# Patient Record
Sex: Male | Born: 1935 | Race: White | Hispanic: No | Marital: Married | Smoking: Former smoker
Health system: Southern US, Community
[De-identification: ages and names within clinical notes are randomized; demographics above are authoritative.]

## PROBLEM LIST (undated history)

## (undated) DIAGNOSIS — G4733 Obstructive sleep apnea (adult) (pediatric): Secondary | ICD-10-CM

## (undated) DIAGNOSIS — Z79891 Long term (current) use of opiate analgesic: Secondary | ICD-10-CM

## (undated) DIAGNOSIS — K219 Gastro-esophageal reflux disease without esophagitis: Secondary | ICD-10-CM

## (undated) DIAGNOSIS — E785 Hyperlipidemia, unspecified: Secondary | ICD-10-CM

## (undated) DIAGNOSIS — I10 Essential (primary) hypertension: Secondary | ICD-10-CM

## (undated) DIAGNOSIS — M419 Scoliosis, unspecified: Secondary | ICD-10-CM

## (undated) DIAGNOSIS — R251 Tremor, unspecified: Secondary | ICD-10-CM

## (undated) DIAGNOSIS — N390 Urinary tract infection, site not specified: Secondary | ICD-10-CM

## (undated) DIAGNOSIS — I2510 Atherosclerotic heart disease of native coronary artery without angina pectoris: Secondary | ICD-10-CM

## (undated) DIAGNOSIS — N200 Calculus of kidney: Secondary | ICD-10-CM

## (undated) DIAGNOSIS — N400 Enlarged prostate without lower urinary tract symptoms: Secondary | ICD-10-CM

## (undated) DIAGNOSIS — M1990 Unspecified osteoarthritis, unspecified site: Secondary | ICD-10-CM

## (undated) DIAGNOSIS — F419 Anxiety disorder, unspecified: Secondary | ICD-10-CM

## (undated) HISTORY — DX: Gastro-esophageal reflux disease without esophagitis: K21.9

## (undated) HISTORY — DX: Benign prostatic hyperplasia without lower urinary tract symptoms: N40.0

## (undated) HISTORY — DX: Scoliosis, unspecified: M41.9

## (undated) HISTORY — DX: Essential (primary) hypertension: I10

## (undated) HISTORY — DX: Hyperlipidemia, unspecified: E78.5

## (undated) HISTORY — PX: OTHER SURGICAL HISTORY: SHX169

## (undated) HISTORY — DX: Urinary tract infection, site not specified: N39.0

## (undated) HISTORY — DX: Tremor, unspecified: R25.1

## (undated) HISTORY — DX: Unspecified osteoarthritis, unspecified site: M19.90

## (undated) HISTORY — DX: Anxiety disorder, unspecified: F41.9

## (undated) HISTORY — DX: Atherosclerotic heart disease of native coronary artery without angina pectoris: I25.10

## (undated) HISTORY — DX: Calculus of kidney: N20.0

## (undated) HISTORY — DX: Long term (current) use of opiate analgesic: Z79.891

## (undated) HISTORY — DX: Obstructive sleep apnea (adult) (pediatric): G47.33

## (undated) HISTORY — PX: APPENDECTOMY: SHX54

---

## 1998-03-24 ENCOUNTER — Ambulatory Visit (HOSPITAL_COMMUNITY): Admission: RE | Admit: 1998-03-24 | Discharge: 1998-03-24 | Payer: Self-pay | Admitting: Gastroenterology

## 1998-03-24 ENCOUNTER — Encounter: Payer: Self-pay | Admitting: Gastroenterology

## 1998-03-27 ENCOUNTER — Ambulatory Visit (HOSPITAL_COMMUNITY): Admission: RE | Admit: 1998-03-27 | Discharge: 1998-03-27 | Payer: Self-pay | Admitting: Gastroenterology

## 1998-03-27 ENCOUNTER — Encounter: Payer: Self-pay | Admitting: Gastroenterology

## 2004-09-11 ENCOUNTER — Encounter: Payer: Self-pay | Admitting: Internal Medicine

## 2007-09-25 ENCOUNTER — Ambulatory Visit: Payer: Self-pay | Admitting: *Deleted

## 2007-09-25 ENCOUNTER — Emergency Department (HOSPITAL_COMMUNITY): Admission: EM | Admit: 2007-09-25 | Discharge: 2007-09-26 | Payer: Self-pay | Admitting: *Deleted

## 2007-09-29 ENCOUNTER — Inpatient Hospital Stay (HOSPITAL_COMMUNITY): Admission: AD | Admit: 2007-09-29 | Discharge: 2007-10-07 | Payer: Self-pay | Admitting: *Deleted

## 2007-09-30 ENCOUNTER — Encounter (INDEPENDENT_AMBULATORY_CARE_PROVIDER_SITE_OTHER): Payer: Self-pay | Admitting: *Deleted

## 2007-09-30 HISTORY — PX: CARDIAC CATHETERIZATION: SHX172

## 2007-10-01 ENCOUNTER — Encounter (INDEPENDENT_AMBULATORY_CARE_PROVIDER_SITE_OTHER): Payer: Self-pay | Admitting: *Deleted

## 2007-10-01 ENCOUNTER — Ambulatory Visit: Payer: Self-pay | Admitting: Thoracic Surgery (Cardiothoracic Vascular Surgery)

## 2007-10-02 HISTORY — PX: CORONARY ARTERY BYPASS GRAFT: SHX141

## 2007-11-02 ENCOUNTER — Encounter
Admission: RE | Admit: 2007-11-02 | Discharge: 2007-11-02 | Payer: Self-pay | Admitting: Thoracic Surgery (Cardiothoracic Vascular Surgery)

## 2007-11-02 ENCOUNTER — Ambulatory Visit: Payer: Self-pay | Admitting: Thoracic Surgery (Cardiothoracic Vascular Surgery)

## 2007-11-12 ENCOUNTER — Encounter (HOSPITAL_COMMUNITY): Admission: RE | Admit: 2007-11-12 | Discharge: 2008-01-01 | Payer: Self-pay | Admitting: *Deleted

## 2008-02-03 ENCOUNTER — Encounter (HOSPITAL_COMMUNITY): Admission: RE | Admit: 2008-02-03 | Discharge: 2008-03-28 | Payer: Self-pay | Admitting: *Deleted

## 2008-06-03 ENCOUNTER — Encounter: Admission: RE | Admit: 2008-06-03 | Discharge: 2008-06-03 | Payer: Self-pay | Admitting: Internal Medicine

## 2008-07-14 ENCOUNTER — Ambulatory Visit (HOSPITAL_COMMUNITY): Admission: RE | Admit: 2008-07-14 | Discharge: 2008-07-14 | Payer: Self-pay | Admitting: Gastroenterology

## 2009-02-14 ENCOUNTER — Encounter: Admission: RE | Admit: 2009-02-14 | Discharge: 2009-02-14 | Payer: Self-pay | Admitting: Internal Medicine

## 2009-02-20 ENCOUNTER — Encounter: Admission: RE | Admit: 2009-02-20 | Discharge: 2009-02-20 | Payer: Self-pay | Admitting: Internal Medicine

## 2009-06-14 ENCOUNTER — Encounter: Admission: RE | Admit: 2009-06-14 | Discharge: 2009-06-14 | Payer: Self-pay | Admitting: Gastroenterology

## 2009-08-29 ENCOUNTER — Ambulatory Visit: Payer: Self-pay | Admitting: Cardiovascular Disease

## 2009-09-14 ENCOUNTER — Ambulatory Visit: Payer: Self-pay | Admitting: Cardiovascular Disease

## 2009-12-10 IMAGING — CR DG CHEST 1V PORT
1 series · 1 of 1 positions shown · non-contrast
Comparison: Portable exam 0109 hours without priors for comparison

CLINICAL DATA: Chest pain, shortness of breath, tachycardia

PORTABLE CHEST - 1 VIEW

[view not recorded]
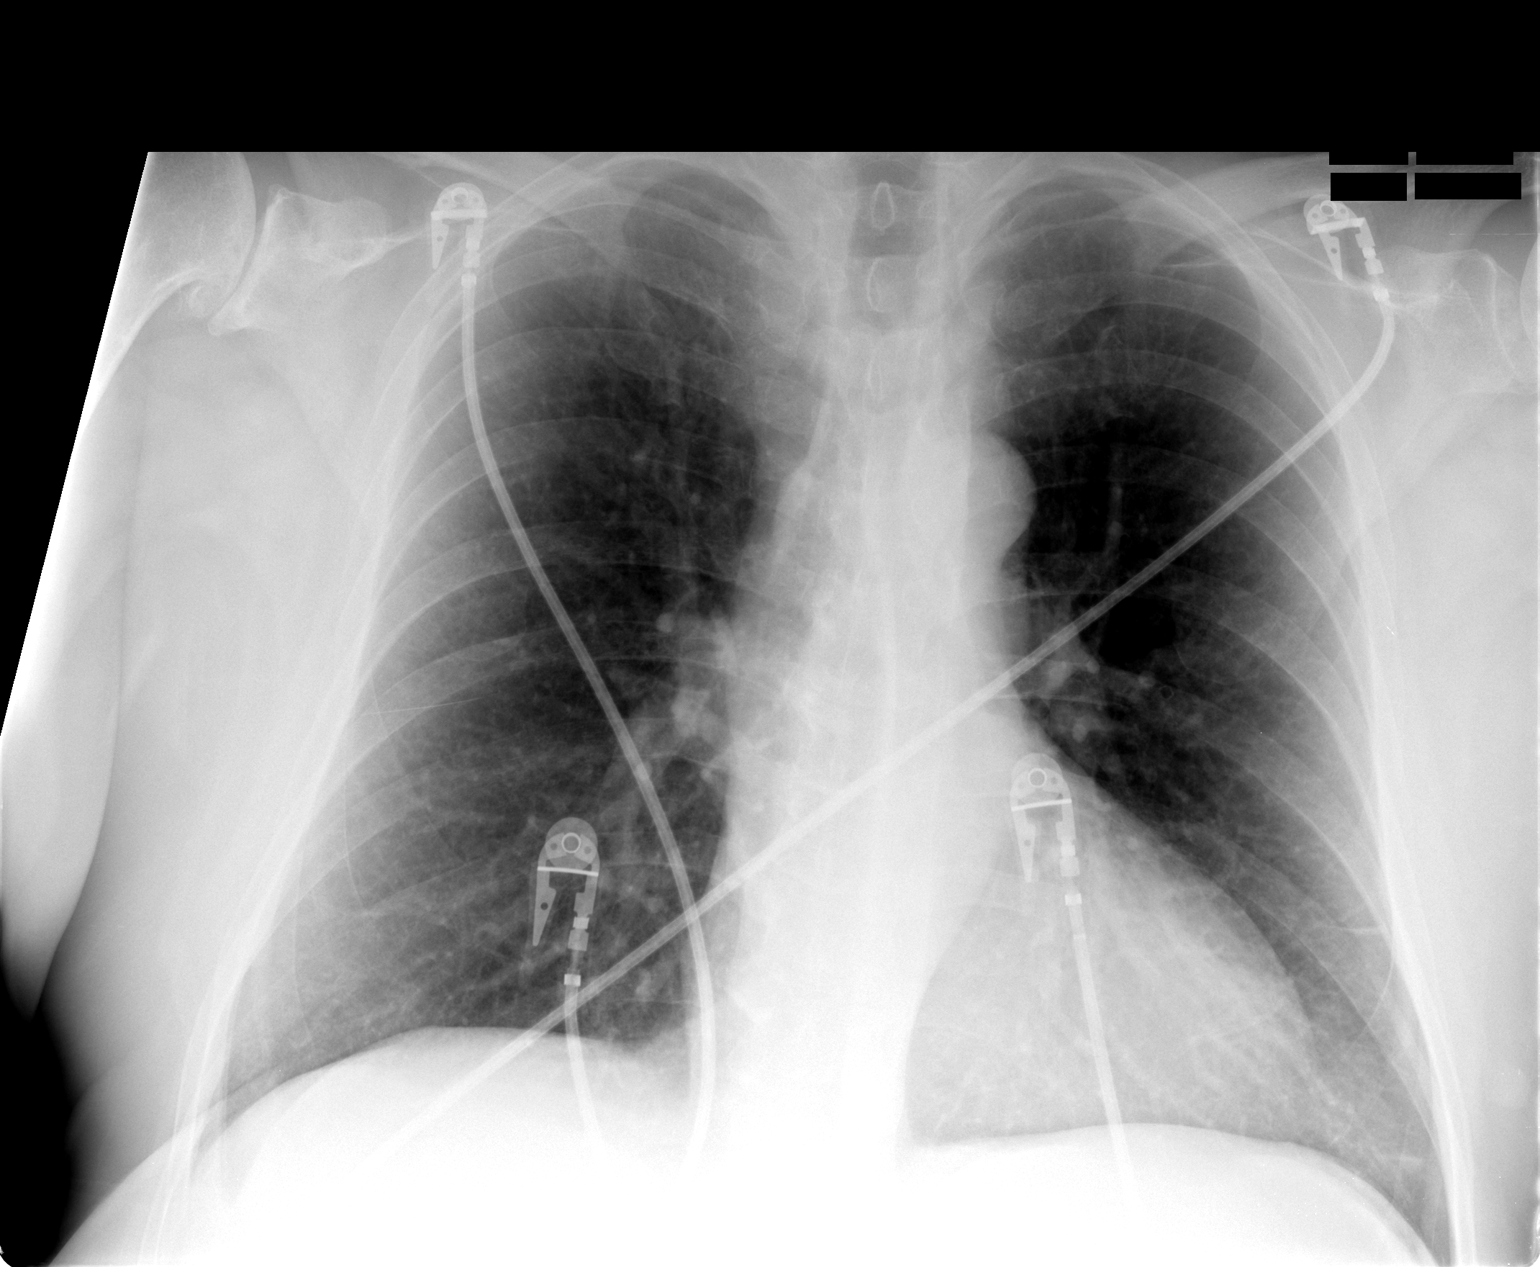

[1 of 1 positions shown; findings below may reference images not displayed]

FINDINGS: Upper normal heart size.
Tortuous aorta.
Pulmonary vascularity normal.
Lungs clear.
Right glenohumeral degenerative changes.
IMPRESSION: No acute abnormalities.

## 2010-02-18 ENCOUNTER — Encounter: Payer: Self-pay | Admitting: Internal Medicine

## 2010-02-22 ENCOUNTER — Ambulatory Visit: Payer: Self-pay | Admitting: Cardiovascular Disease

## 2010-02-27 NOTE — Letter (Signed)
Summary: [facility]   Imported By: Sherian Rein 07/14/2009 11:15:30  _____________________________________________________________________  External Attachment:    Type:   Image     Comment:   External Document

## 2010-05-07 LAB — CLOTEST (H. PYLORI), BIOPSY: Helicobacter screen: NEGATIVE

## 2010-06-12 NOTE — Op Note (Signed)
NAMELORENZA, Michael Donaldson              ACCOUNT NO.:  [id number]   MEDICAL RECORD NO.:  [id number]          PATIENT TYPE:  INP   LOCATION:  2316                         FACILITY:  [facility]   PHYSICIAN:  Burna Forts, M.D.DATE OF BIRTH:  July 07, 1934   DATE OF PROCEDURE:  10/02/2007  DATE OF DISCHARGE:                               OPERATIVE REPORT   INDICATIONS FOR PROCEDURE:  Mr. Shackleton is a 76 year old gentleman  referred by Dr. Lady Deutscher for coronary artery bypass grafting to be  performed by Dr. Tressie Stalker.  We have been asked to place a TEE probe  for evaluation of cardiac structures and function.   The patient was brought to the holding area on the morning of surgery  where under local anesthesia with sedation, radial and pulmonary lines  were started.  He was then taken to OR for routine induction of general  anesthesia after which the TEE probe is protected, lubricated, and  passed oropharyngeally into the stomach and then slightly withdrawn for  imaging the cardiac structures.   PRE-CARDIOPULMONARY BYPASS TEE EXAMINATION:  Left ventricle.  Left  ventricular chamber is initially seen in the short-axis view.  There is  mild-to-moderate left ventricular hypertrophy seen.  In the short-axis  view, there appears to be normal overall contractility with all septal  and inferior and posterior and anterior walls thickening and contracting  in a normal pattern.  EF is estimated to be in the 40-45% range.  The  long-axis view suggests some mild apical and anteroseptal hypokinesis  but overall this is satisfactory appearing ventricle both in long and  short-axis views in terms of his contractility.   Papillary muscles are well outlined, and there are no masses noted  within.   Mitral valve.  This is a thin, compliant, mobile mitral valve apparatus  seen in multiple views.  There is satisfactory coaptation just below the  level of the mitral annulus.  During systolic contraction,  this is  associated with only trace mitral regurgitant flow.  There is no  obstruction to inflow and appears to be a satisfactory and normal mitral  valve.   Left atrial chamber is normal in size and function.   Aortic valve.  There is mild aortic sclerosis.  There are 3 cusps  opening widely with no obstruction to flow and on diastole, there is no  aortic insufficiency appreciated.   The right ventricle, tricuspid valve, and right atrial chambers are all  normal in size and function and structure.   The patient was placed on cardiopulmonary bypass.  Coronary artery  bypass grafting is carried out.  The patient was then separated from  cardiopulmonary bypass with the initial attempt.   POST-CARDIOPULMONARY BYPASS TEE EXAMINATION:  Left ventricle.  There is  good excellent contractility noted in the left ventricular chamber in  this post bypass period, seen in both short-axis and long-axis views.  There remains a mild hypocontractile area apically and anteroseptally,  but overall there is good contractile pattern noted in this post-bypass.   No other changes from the pre-bypass period are appreciated.  The mitral  regurgitant jet remained small and trace, and there are no other further  apparent changes noted.  The patient is returned to the cardiac  intensive care unit in stable condition.           ______________________________  Burna Forts, M.D.     JTM/MEDQ  D:  10/02/2007  T:  10/03/2007  Job:  [id number]

## 2010-06-12 NOTE — H&P (Signed)
[name], Michael Donaldson              ACCOUNT NO.:  [id number]   MEDICAL RECORD NO.:  [id number]          PATIENT TYPE:  INP   LOCATION:                               FACILITY:  [facility]   PHYSICIAN:  Elmore Guise., M.D.DATE OF BIRTH:  16-Mar-1934   DATE OF ADMISSION:  09/29/2007  DATE OF DISCHARGE:                              HISTORY & PHYSICAL   INDICATIONS FOR ADMISSION:  Recurrent chest pain, now with abnormal  stress test.   HISTORY OF PRESENT ILLNESS:  Michael Donaldson is a very pleasant 76 year old  white male with a past medical history of hypertension and obstructive  sleep apnea, who had recent ER evaluation on September 26, 2007.  At that  time, he was having some chest pain in his lower sternal area, which  radiated to his abdomen.  He had no nausea, no significant shortness of  breath.  This lasted for an hour.  He came for evaluation in the  emergency room and everything was normal.  He was seen by cardiologist  on call and sent out of the hospital with outpatient follow-up.  His  cardiac enzymes and ECG were normal.  Since going home, he has had  recurrence of lower sternal chest pain, now with radiation to his left  arm.  Today he came in for a stress test.  He walked for 5 minutes,  however, started having chest pain at 2 minutes and 45 seconds into  exercise.  He had no significant ECG changes, but did have hypertensive  blood pressure response with peak systolic blood pressure 200/100.  He  continued with chest pain 10 minutes post exercise.  His stress images  showed a moderate-sized inferoapical defect.  His EF was 56% with no  wall motion abnormalities.  He was given sublingual nitroglycerin spray  with significant improvement in both blood pressure and chest pain  symptoms.  By the time he had left the office, he was having no further  chest discomfort.  He reports prior to coming to the ER on Friday, his  symptoms started approximately a week to a week and a half ago  and these  would come and go, but with primarily with significant activity.  He  initially thought this was his anxiety acting up.  He has had no  fever, chills, nausea, vomiting, or diarrhea.  No problems with cough.  He was recently diagnosed with his sleep apnea and was due to start his  CPAP treatment tonight.   REVIEW OF SYSTEMS:  As per HPI.  All others are negative.   CURRENT MEDICATIONS:  1. Ambien 5 mg p.r.n.  2. Aspirin 81 mg daily.  3. Lisinopril 10 mg daily.  4. Naproxen p.r.n.   ALLERGIES:  None.   FAMILY HISTORY:  Positive for heart disease with his father.   SOCIAL HISTORY:  He is married.  Denies any alcohol.  Has remote tobacco  use, greater than 40 years ago.   PHYSICAL EXAMINATION:  VITAL SIGNS:  His blood pressure is 160/80, his  heart rate is 60 to 70, showing sinus rhythm.  GENERAL:  He is a very pleasant elderly white male, alert and oriented  times four, in no acute distress.  NECK:  He has no JVD, no bruits.  LUNGS:  Clear, with breath sounds to the bases.  HEART:  Regular, with normal S1 and S2.  A very soft 2/6 holosystolic  murmur is noted.  ABDOMEN:  Obese, soft, nontender, nondistended.  No rebound or guarding.  EXTREMITIES:  Warm with 2+ pulses and no significant edema.   LABORATORY DATA:  His blood work from his most recent hospitalization  and ER evaluation showed negative cardiac markers times three, with peak  myoglobin of 108.  D-dimer was 0.48.  His BUN and creatinine were 21 and  1.2, his potassium level was 3.6.  Hemoglobin of 15.0.  His ECG today  shows normal sinus rhythm, normal axis, small inferior Q-waves are  noted, no significant ST or T-wave changes.   IMPRESSIONS:  1. Recurrence of chest pain with minimal exertion on Bruce protocol,      consistent with unstable angina.  2. Recent abnormal Cardiolite, showing a moderate-sized inferoapical      defect.  3. History of hypertension.  4. Obstructive sleep apnea.   PLAN:  The  patient will be admitted to the hospital.  We will start  Lovenox 1 mg/kg subcu times one dose.  He is currently chest pain-free.  He will be given aspirin, metoprolol 25 mg q.12 h., as well as low dose  lisinopril.  We will start Lipitor 80 mg once daily.  I discussed  cardiac catheterization with possible intervention and this will be  performed tomorrow unless he has worsening symptoms that cannot be  controlled medically.  He is currently chest pain-free.  All his  questions were answered prior to sending him to the hospital.      Elmore Guise., M.D.  Electronically Signed     TWK/MEDQ  D:  09/29/2007  T:  09/29/2007  Job:  [id number]

## 2010-06-12 NOTE — Consult Note (Signed)
Michael Donaldson, FRIEDLAND              ACCOUNT NO.:  [id number]   MEDICAL RECORD NO.:  [id number]          PATIENT TYPE:  EMS   LOCATION:  MAJO                         FACILITY:  [facility]   PHYSICIAN:  [name], MDDATE OF BIRTH:  1934-03-13   DATE OF CONSULTATION:  DATE OF DISCHARGE:  09/26/2007                                 CONSULTATION   REFERRING PHYSICIAN:  Chriss Driver, MD   REASON FOR CONSULTATION:  Chest pain syndrome.   HISTORY OF PRESENT ILLNESS:  The patient is a pleasant 76 year old  gentleman with history of chronic hypertension, obstructive sleep apnea,  and had presented to the ER with chest pain.  The patient localizes the  chest pain to include the lower sternal area on both sides as well as  the abdomen.  He does not complain of any nausea or emesis.  The chest  pain was relieved in an hour.  He did not complain of any prior such  episodes.  He does not have any history of exertional angina.  His first  set of cardiac biomarkers are negative, and electrocardiogram did not  show any acute ST-T changes, the chest pain was mild-to-moderate in  severity, and was sharp in nature.  He does not offer any other  complaints.   PAST MEDICAL HISTORY:  1. Chronic hypertension.  2. Obstructive sleep apnea.   SOCIAL HISTORY:  No alcohol or drug abuse.  No tobacco abuse.  Lives  with his wife.   FAMILY HISTORY:  Significant for CAD in the father.   MEDICATIONS:  1. Ambien 5 mg p.r.n. daily.  2. Aspirin 81 mg daily.  3. Lisinopril 10 mg daily.  4. Naproxen sodium 500 mg p.r.n.   ALLERGIES:  None.   PHYSICAL EXAMINATION:  VITAL SIGNS:  Blood pressure is 130/76, pulse  rate is 69, respiratory rate is 12, and temperature is 98.6.  HEAD AND NECK:  Atraumatic and normocephalic.  Anicteric sclerae.  No  pallor of the conjunctivae.  Mucous membranes moist.  JVP is normal.  Trachea is midline.  CHEST:  Symmetrically equal air entry.  Normal breath sounds.  S1 and S2  regular.  ABDOMEN:  Soft and obese.  No hepatosplenomegaly.  No tenderness.  EXTREMITIES:  No edema.  Normal pulses.  NEUROLOGIC:  Nonfocal with intact cognition.   LABORATORY VALUE:  Sodium 139, potassium 3.6, BUN 21, creatinine 1.2,  hematocrit 44%, and hemoglobin 15 g/dL.  Troponin I is less than 0.05.  CK-MB is 1.6.  X-ray, no acute abnormalities.  CT scan of the chest, no  evidence of PE.  Electrocardiogram, sinus rhythm with no acute ST-T  changes.   ASSESSMENT AND PLAN:  Chest pain is most probably nonanginal.  He has no  objective markers for myocardial ischemia or injury.  The patient has a  long history of anxiety disorder, and has been on Effexor, and has been  slowly weaning himself off on that medication.  He likens these symptoms  to his prior anxiety attacks.  I have spoken to Dr. Reyes Ivan and he will  follow up with him on Monday for an  outpatient stress test.  Dr. Javier Glazier  is going to obtain one more set of cardiac enzymes, and if they are  negative, he will be sent home from the emergency room.      [name], MD  Electronically Signed     RN/MEDQ  D:  09/26/2007  T:  09/26/2007  Job:  [id number]

## 2010-06-12 NOTE — Op Note (Signed)
Michael Donaldson, Michael Donaldson              ACCOUNT NO.:  [id number]   MEDICAL RECORD NO.:  [id number]          PATIENT TYPE:  INP   LOCATION:  2316                         FACILITY:  [facility]   PHYSICIAN:  Salvatore Decent. Cornelius Moras, M.D. DATE OF BIRTH:  01/07/1935   DATE OF PROCEDURE:  10/02/2007  DATE OF DISCHARGE:                               OPERATIVE REPORT   PREOPERATIVE DIAGNOSIS:  Severe 3-vessel coronary artery disease with  new-onset unstable angina.   POSTOPERATIVE DIAGNOSIS:  Severe 3-vessel coronary artery disease with  new-onset unstable angina.   PROCEDURE:  Median sternotomy for coronary artery bypass grafting x4  (left internal mammary artery to distal left anterior descending  coronary artery, saphenous vein graft to circumflex marginal branch,  saphenous vein graft to acute marginal branch with sequential saphenous  vein graft to distal right coronary artery, and endoscopic saphenous  vein harvest from left thigh and left lower leg).   SURGEON:  Salvatore Decent. Cornelius Moras, MD   ASSISTANT:  Stephanie Acre Dominick, PA   ANESTHESIA:  General.   BRIEF CLINICAL NOTE:  The patient is a 76 year old male with no previous  history of coronary artery disease who presents with new-onset symptoms  of unstable angina pectoris.  Cardiac catheterization performed by Dr.  Lady Deutscher demonstrates severe 3-vessel coronary artery disease with  normal left ventricular function.  A full consultation note has been  dictated previously.  The patient and his wife have been counseled at  length regarding the indications, risks, and potential benefits of  surgery.  Alternative treatment strategies have been discussed.  They  understand and accept all associated risks of surgery and desire to  proceed as described.   OPERATIVE FINDINGS:  1. Normal left ventricular systolic function.  2. Small-caliber, but good-quality left internal mammary artery and      saphenous vein conduit for grafting.  3. Small caliber  coronary arteries.  4. Diffusely diseased distal right coronary artery.   OPERATIVE NOTE:  The patient was brought to the operating room on the  above-mentioned date and central monitoring was established by the  Anesthesia service under the care and direction of Dr. Sharee Holster.  Specifically, a Swan-Ganz catheter was placed through the right internal  jugular approach.  A radial arterial line was placed.  Intravenous  antibiotics were administered.  Following induction with general  endotracheal anesthesia, a Foley catheter is placed.  The patient's  chest, abdomen, both groins, and both lower extremities were prepared  and draped in sterile manner.  Baseline transesophageal echocardiogram  was performed by Dr. Jacklynn Bue.  This demonstrates normal left  ventricular function.  No other significant abnormalities are noted.   A median sternotomy incision is performed and left internal mammary  artery was dissected from the chest wall and prepared for bypass  grafting.  The left internal mammary artery is good-quality conduit.  Simultaneously, saphenous vein was obtained the patient's left thigh and  the upper portion of the left lower leg using endoscopic vein harvest  technique.  Initially, an incision is made in the right lower extremity,  but the vein on this side  appears to be small and is not harvested.  The  saphenous vein which is removed is slightly small-caliber but otherwise  good-quality conduit.  After the saphenous vein has been removed, the  small incisions in the left lower extremity are closed in multiple  layers with running absorbable suture.  The patient is heparinized  systemically and left internal mammary artery is transected distally.  It is noted to have adequate flow.   The pericardium was opened.  The ascending aorta is normal in  appearance.  The ascending aorta and right atrium were cannulated for  cardiopulmonary bypass.  Adequate heparinization is  verified.  Cardiopulmonary bypass is begun and the surface of the heart is  inspected.  Distal target vessels are selected for coronary artery  bypass grafting.  All of the patient's coronary arteries are somewhat  small caliber for a male of size.  The distal right coronary artery is  diffusely diseased.  The terminal branches of the distal right coronary  artery are quite small.  There was a large acute marginal branch that  appears to be the dominant and important vessel supplying the inferior  wall.  Temperature probe is placed in left ventricular septum and a  cardioplegic catheter is placed in the ascending aorta.   The patient is allowed to cool passively to 32 degrees systemic  temperature.  The aortic cross-clamp was applied and cold blood  cardioplegia is administered in an antegrade fashion through the aortic  root.  Iced saline slush is applied for topical hypothermia.  The  initial cardioplegic arrest and myocardial cooling were felt to be  excellent.  Repeat doses of cardioplegia administered intermittently  throughout the cross-clamp portion of the operation through the aortic  root and down subsequently placed vein grafts to maintain left  ventricular septal temperature below 15 degrees centigrade.   The following distal coronary anastomoses were performed:  1. The acute marginal branch off the right coronary artery is grafted      with a saphenous vein graft in a side-to-side fashion.  This vessel      measured 1.6 mm in diameter and is a good quality target vessel for      grafting.  2. The distal right coronary artery is grafted using a sequential      saphenous vein graft off the vein placed to the acute marginal      branch.  This vessel was diffusely diseased.  At the site of distal      bypass, it measures 1.4 mm in diameter and is a fair-to-poor      quality target vessel for grafting.  3. The circumflex marginal branch is grafted with a saphenous vein       graft in an end-to-side fashion.  This vessel measures 1.5 mm in      diameter and is a fair-to-good quality target vessel for grafting.  4. The distal left anterior descending coronary artery is grafted with      left internal mammary artery in an end-to-side fashion.  This      vessel measured 1.3 mm in diameter and is a fair-to-good quality      target vessel for grafting.   Both proximal saphenous vein anastomoses were performed directly to the  ascending aorta prior to removal of the aortic cross-clamp.  The left  ventricular septal temperature rised rapidly with reperfusion of the  left internal mammary artery.  The aortic cross-clamp was removed after  a total cross-clamp time  of 73 minutes.  The heart began to beat  spontaneously and normal sinus rhythm resumed.  All proximal and distal  coronary anastomoses were inspect for hemostasis and appropriate graft  orientation.  Epicardial pacing wires were fixed to the right  ventricular free wall and to the right atrial appendage.  The patient is  rewarmed to 37 degrees centigrade temperature.  The patient was weaned  from cardiopulmonary bypass without difficulty.  The patient's rhythm at  separation from bypass is normal sinus rhythm.  No inotropic support is  required.  Total cardiopulmonary bypass time of the operation is 91  minutes.  Followup transesophageal echocardiogram performed by Dr.  Jacklynn Bue after separation from bypass demonstrates normal left  ventricular function.   The venous and arterial cannulae are removed uneventfully.  Protamine is  administered to reverse the anticoagulation.  The mediastinum and left  chest are irrigated with saline solution containing vancomycin.  Meticulous surgical hemostasis was ascertained.  The mediastinum and  left chest were drained with 3 chest tubes exited through separate stab  incisions inferiorly.  The pericardium and soft tissues anterior to the  aorta reapproximated loosely.   The sternum is closed with double-  strength sternal wire.  The soft tissues anterior to the sternum are  closed in multiple layers and the skin is closed with running  subcuticular skin closure.   The patient tolerated the procedure well and was transported to the  surgical intensive care unit in stable condition.  There are no  intraoperative complications.  All sponge, instrument, and needle counts  verified correct at completion of the operation.  No blood products were  administered.      Salvatore Decent. Cornelius Moras, M.D.  Electronically Signed     CHO/MEDQ  D:  10/02/2007  T:  10/03/2007  Job:  [id number]   cc:   Elmore Guise., M.D.  Barry Dienes Eloise Harman, M.D.

## 2010-06-12 NOTE — Assessment & Plan Note (Signed)
OFFICE VISIT   ARAMIS, WEIL  DOB:  Dec 30, 1934                                        November 02, 2007  CHART #:  [id number]   HISTORY OF PRESENT ILLNESS:  The patient returns for routine followup  status post coronary artery bypass grafting x4 on October 02, 2007, for  severe 3-vessel coronary artery disease with new-onset unstable angina.  His postoperative recovery has been uneventful.  Following hospital  discharge, the patient has continued to improve.  He has been seen in  followup on one occasion by Dr. Reyes Ivan and he plans to follow up with  Dr. Reyes Ivan again later this week.  Overall, he thinks he has continued  to improve.  He has mild residual soreness in his chest and back.  He  denies any shortness of breath.  He denies any exertional chest  discomfort similar to the chest pain he had prior to surgery.  He is  sleeping fairly well at night.  His appetite is slowly improving.  He is  getting around fairly well, although he admits that he is not pushing  himself very much physically.  He has not yet started the cardiac  rehabilitation program.  His medications remain unchanged from the time  of hospital discharge.  The remainder of his review of systems is  remarkable.   PHYSICAL EXAMINATION:  GENERAL:  Notable for well-appearing male.  VITAL SIGNS:  Blood pressure 124/83, pulse 75, and oxygen saturation 98%  on room air.  CHEST:  A median sternotomy incision that is healing nicely.  The  sternum is stable on palpation.  Breath sounds are clear to auscultation  and symmetrical bilaterally.  No wheezes or rhonchi are noted.  CARDIOVASCULAR:  Regular rate and rhythm.  No murmurs, rubs, or gallops  are noted.  ABDOMEN:  Soft and nontender.  EXTREMITIES:  Warm and well perfused.  There is no lower extremity  edema.  The small incisions from endoscopic vein harvest have healed  nicely.  The remainder of his physical exam is unremarkable.   DIAGNOSTIC TEST:  Chest [facility]ray obtained at the [facility]  today is reviewed.  This demonstrates clear lung fields bilaterally.  There are no pleural effusions.  All the sternal wires appeared intact.  No other abnormalities are noted.   IMPRESSION:  Satisfactory progress following recent coronary artery  bypass grafting.   PLAN:  I have encouraged the patient to continue to gradually increase  his physical activity as tolerated, but his only specific limitation at  this time remaining that he refrain from heavy lifting or strenuous use  of his arms or shoulders for at least another 2 months.  I have  encouraged him to get involve in the cardiac rehabilitation program.  I  think, he can start driving short distances during daylight hours and  gradually increase from there.  As far as going back to work, I think he  should wait another 4 weeks to continue to increase his activity and see  how he does.  He should be able to go back to work by November 29, 2007,  if he continues to do well.  All of his questions have been addressed.  We have not made any changes in his current medications.  In the future,  the patient will call and return  to see [place] only should further problems  or difficulties arise.   Salvatore Decent. Cornelius Moras, M.D.  Electronically Signed   CHO/MEDQ  D:  11/02/2007  T:  11/02/2007  Job:  [id number]   cc:   Elmore Guise., M.D.

## 2010-06-12 NOTE — Consult Note (Signed)
NAMEMILLIE, SHORB              ACCOUNT NO.:  [id number]   MEDICAL RECORD NO.:  [id number]          PATIENT TYPE:  INP   LOCATION:  2399                         FACILITY:  [facility]   PHYSICIAN:  Salvatore Decent. Cornelius Moras, M.D. DATE OF BIRTH:  Apr 17, 1934   DATE OF CONSULTATION:  09/30/2007  DATE OF DISCHARGE:                                 CONSULTATION   REQUESTING PHYSICIAN:  Rosine Abe, MD   REASON FOR CONSULTATION:  Severe 3-vessel coronary artery disease with  unstable angina.   HISTORY OF PRESENT ILLNESS:  Mr. Lard is a 76 year old male with no  previous history of coronary artery disease, but risk factors notable  for history of hypertension and remote history of tobacco use.  The  patient's past medical history is also notable for recently diagnosed  obstructive sleep apnea.  The patient states that he was in his usual  state of health until last Friday evening when he developed some  dizziness and lower chest discomfort for which he presented to the  emergency room at [facility].  The patient ruled out for acute  myocardial infarction based on serial cardiac enzymes and all  electrocardiograms were normal.  A chest CT scan was performed and was  unremarkable, although notable for significant atherosclerosis  surrounding the coronary arteries.  The patient was discharged home with  instructions for followup to undergo an outpatient stress test.  The  patient was seen in consultation by Dr. Reyes Ivan, and a stress test was  performed on September 29, 2007.  During the interval period of time  between his ER evaluation and stress test, the patient had some  intermittent recurrence of substernal chest pain.  He had very early  positive stress test with chest pain at only 2 minutes and 45 seconds  into a stress Bruce protocol.  He developed hypertensive blood pressure  response, although there were no significant EKG changes.  Stress  imaging showed a moderate-sized inferior apical  defect with resting  ejection fraction of 56%.  The patient was admitted to the hospital and  stabilized medically.  He subsequently underwent cardiac catheterization  on September 30, 2007, by Dr. Reyes Ivan.  The patient was found to have  severe 3-vessel coronary artery disease.  Cardiothoracic surgical  consultation was requested.   REVIEW OF SYSTEMS:  GENERAL:  The patient reports progressive fatigue  over the last year, so that has been attributed to not sleeping well.  The patient has normal appetite.  He has lost approximately 10 pounds in  weight over the last year on a diet change regimen.  CARDIAC:  The  patient reports some intermittent mild episodes of chest pain with  exertion in recent months but none that was severe.  The patient  developed severe chest pain associated with dizziness last Friday,  prompting his initial evaluation.  His most severe episode of pain  occurred during his stress test.  The patient reports mild exertional  shortness of breath.  The patient developed severe shortness of breath  during the stress test.  He denies resting shortness of breath, PND,  orthopnea,  lower extremity edema.  The patient had dizzy spell last  Friday but otherwise, denies other dizzy spells or syncopal episode.  RESPIRATORY:  Notable in that the patient has periods of apnea when he  sleeps at night according to his wife, and he snores.  The patient  denies any productive cough.  He reports no hemoptysis or wheezing.  GASTROINTESTINAL:  Negative.  The patient has no difficulty swallowing.  He reports no hematochezia, hematemesis, or melena.  Bowel function is  stable.  GENITOURINARY:  Notable for some difficulty starting his  stream.  This is stable.  The patient reports no urgency or frequency.  MUSCULOSKELETAL:  Notable for chronic arthralgias related to  degenerative arthritis in his right shoulder and his back.  NEUROLOGIC:  Negative.  PSYCHIATRIC:  Notable for some history of  anxiety.  HEENT:  Negative.   PAST MEDICAL HISTORY:  1. Hypertension.  2. Obstructive sleep apnea.  3. Remote tobacco use.  4. Anxiety.   PAST SURGICAL HISTORY:  1. Appendectomy.  2. Tonsillectomy.   FAMILY HISTORY:  Noncontributory.   SOCIAL HISTORY:  The patient is married and lives with his wife.  He is  an Nutritional therapist.  He reports a relatively sedentary lifestyle, but  he is able to do whatever he wants physically.  He has a remote history  of tobacco use, although he quit smoking more than 40 years ago.  He  denies any alcohol abuse.   MEDICATIONS PRIOR TO ADMISSION:  1. Ambien as necessary for sleep.  2. Aspirin 81 mg daily.  3. Lisinopril 10 mg daily.  4. Naprosyn as necessary for arthritis pain.   DRUG ALLERGIES:  None known.   PHYSICAL EXAMINATION:  The patient is a well-appearing mildly obese  male, who appears his stated age, in no acute distress.  He is afebrile.  HEENT exam is unrevealing.  The neck is supple.  There is no cervical,  nor supraclavicular lymphadenopathy.  There is no jugular venous  distention.  No carotid bruits are noted.  Auscultation of the chest  demonstrates clear breath sounds, which are symmetrical bilaterally.  No  wheezes or rhonchi are noted.  Cardiovascular exam includes regular rate and rhythm.  No murmurs, rubs,  or gallops are noted.  The abdomen is soft, nondistended, nontender.  There are no palpable masses.  The extremities are warm and well-  perfused.  There is no lower extremity edema.  Distal pulses are  palpable in both lower legs at the ankle.  The skin is clean, dry,  healthy appearing throughout.  Rectal and GU exams are both deferred.  Neurologic examination is grossly nonfocal.   DIAGNOSTIC TESTS:  Cardiac catheterization performed by Dr. Reyes Ivan and  Dr. Elease Hashimoto were reviewed.  This demonstrates a long segment 70% stenosis  of the proximal to mid left anterior descending coronary artery with  heavy  calcification in the proximal vessel.  There is 70% proximal  stenosis of left circumflex with 78% stenosis of the circumflex marginal  branch.  There is 95% proximal stenosis of the right coronary artery  with diffuse disease in the distal right coronary artery.  Left  ventricular function appears preserved.   IMPRESSION:  Severe 3-vessel coronary artery disease with new-onset  unstable angina.  I believe that Mr. Ricketson would best be treated with  surgical revascularization.   PLAN:  I have discussed options at length with Mr. Jim.  Alternative  treatment strategies have been discussed.  He understands and accepts  all potential associated risks of surgery including but not limited to  risk of death, stroke, myocardial infarction, congestive heart failure,  respiratory failure, pneumonia, bleeding requiring blood transfusion,  arrhythmia, infection, and recurrent coronary artery disease.  All of  his questions have been addressed.      Salvatore Decent. Cornelius Moras, M.D.  Electronically Signed     CHO/MEDQ  D:  10/01/2007  T:  10/02/2007  Job:  [id number]   cc:   Elmore Guise., M.D.  Barry Dienes Eloise Harman, M.D.

## 2010-06-12 NOTE — Discharge Summary (Signed)
NAMESAMIN, MILKE              ACCOUNT NO.:  [id number]   MEDICAL RECORD NO.:  [id number]          PATIENT TYPE:  INP   LOCATION:  2010                         FACILITY:  [facility]   PHYSICIAN:  Salvatore Decent. Cornelius Moras, M.D. DATE OF BIRTH:  [birth year]   DATE OF ADMISSION:  09/29/2007  DATE OF DISCHARGE:                               DISCHARGE SUMMARY   FINAL DIAGNOSIS:  Severe three-vessel coronary artery disease with new-  onset unstable angina.   IN-HOSPITAL DIAGNOSES:  1. Volume overload postoperatively.  2. Acute blood loss anemia postoperatively.   SECONDARY DIAGNOSES:  1. Hypertension.  2. Obstructive sleep apnea.  3. Remote tobacco abuse.  4. Anxiety.  5. Status post appendectomy.  6. Status post tonsillectomy.   IN-HOSPITAL OPERATIONS AND PROCEDURES:  1. Cardiac catheterization.  2. Coronary artery bypass grafting x4 using a left internal mammary      artery to distal left anterior descending coronary artery,      saphenous vein graft to circumflex marginal branch, saphenous vein      graft to acute marginal branch with sequential saphenous vein graft      to distal right coronary artery.  Endoscopic saphenous vein harvest      from left thigh and left lower leg.   Transesophageal echocardiogram.   HISTORY AND PHYSICAL AND HOSPITAL COURSE:  The patient is a 76 year old  male with no previous history of coronary artery disease who presents  with new onset symptoms of unstable angina.  Cardiac catheterization  done by Dr. Reyes Ivan demonstrates severe three-vessel coronary artery  disease with normal left ventricular function.  Following this, Dr. Cornelius Moras  was consulted for evaluation of coronary artery bypass grafting.  Dr.  Cornelius Moras discussed with the patient undergoing coronary artery bypass  grafting.  He discussed risks and benefits with the patient.  The  patient also understood and agreed to proceed.  Surgery was scheduled  for October 02, 2007.  Preoperatively, the patient  underwent bilateral  carotid duplex ultrasound showing no significant ICA stenosis.  The  patient remained stable preoperatively.  For further details of the  patient's past medical history and physical exam, please see dictated  H&P.   The patient was taken to the operating room on October 02, 2007, where  he underwent coronary artery bypass grafting x4 using a left internal  mammary artery to distal left anterior descending coronary artery,  saphenous vein graft to circumflex marginal branch, saphenous vein graft  to acute marginal branch with sequential saphenous vein graft to distal  right coronary artery.  Endoscopic saphenous vein harvest from left  thigh and left lower leg was done.  The patient tolerated this procedure  and was transferred to the intensive care unit in stable condition.  Postoperatively, the patient was not able to be extubated.  Post  extubation he was noted to be alert and oriented x4.  Neuro intact.  He  was hemodynamically stable postoperatively.  The patient's postoperative  course was pretty much unremarkable.  All chest tubes and lines were  discontinued in normal fashion.  Vital signs were stable.  While in the  intensive care unit, he was up ambulating well with assistance.  He was  felt to be stable and ready for transfer out to the PCT on postop day  #2.  The patient continued to progress well.  He remained in normal  sinus rhythm.  He was able to be started on low-dose beta-blocker  postoperatively.  Heart rate and blood pressure tolerated it well.  Currently, the patient has not been restarted on ACE inhibitors  secondary to blood pressure being stable, low.  The patient was able to  be weaned off oxygen sating greater than 90% on room air.  He continues  to use his incentive spirometer.  He did have some mild acute blood loss  anemia postoperatively but did not require any transfusions.  This was  monitored and remained stable.  The patient also  had some mild volume  overload and was started on diuretics.  Daily weights obtained and the  patient was back near his baseline weight prior to discharge home.  The  patient continued to ambulate well with cardiac rehab.  He was  tolerating diet well.  No nausea, vomiting noted.   The patient on October 06, 2007, on postop day #4 noted to be afebrile.  Vital signs stable.  CBC showed a white count 10.4, hemoglobin 10.1,  hematocrit 29.9, platelet count 205.  BMP day prior showed sodium of  136, potassium 4.3, chloride of 104, bicarb of 27, BUN of 17, creatinine  1.06, and glucose 102.  The patient is felt to be ready for discharge  home in the a.m. October 07, 2007, postop day #5, pending he remained  stable.   FOLLOWUP APPOINTMENTS:  Followup appointment has been arranged with Dr.  Cornelius Moras for November 02, 2007 at 12:45 p.m..  The patient will need to obtain  PMI chest x-ray 30 minutes prior to this appointment.  The patient will  need to follow up with Dr. Reyes Ivan in 2 weeks.  He will need to contact  Dr. Silva Bandy office to make these arrangements.   ACTIVITY:  The patient was instructed no driving unless released to do  so, no heavy lifting over 10 pounds.  He was told to ambulate 3-4 times  per day, progress as tolerated and to continue his breathing exercises.   INCISIONAL CARE:  The patient is told to shower, washing his incisions  using soap and water.  He is to contact the office if he develops any  drainage or opening from any of his incision sites.   DIET:  The patient educated on diet to be low-fat and low-salt.   DISCHARGE MEDICATIONS:  1. Ambien 10 mg at night p.r.n.  2. Lipitor 80 mg at night.  3. Aspirin 325 mg daily.  4. Toprol-XL 25 mg daily.  5. Lasix 40 mg daily x5 days.  6. Potassium chloride 20 mEq daily x5 days.  7. Oxycodone 5 mg 1-2 tablets q. 4-6 h. p.r.n.      Theda Belfast, PA      Salvatore Decent. Cornelius Moras, M.D.  Electronically Signed     KMD/MEDQ  D:  10/06/2007  T:  10/07/2007  Job:  [id number]   cc:   Elmore Guise., M.D.

## 2010-06-12 NOTE — Op Note (Signed)
NAMESAUNDERS, ARLINGTON              ACCOUNT NO.:  [id number]   MEDICAL RECORD NO.:  [id number]          PATIENT TYPE:  AMB   LOCATION:  ENDO                         FACILITY:  [facility]   PHYSICIAN:  James L. Malon Kindle., M.D.DATE OF BIRTH:  Sep 07, 1934   DATE OF PROCEDURE:  07/14/2008  DATE OF DISCHARGE:                               OPERATIVE REPORT   PROCEDURE:  Esophagogastroduodenoscopy with biopsy.   MEDICATIONS:  1. Cetacaine spray.  2. Fentanyl 100 mcg.  3. Versed 10 mg IV.   INDICATIONS:  Epigastric abdominal pain.   DESCRIPTION OF PROCEDURE:  Procedure had been explained to the patient  and consent was obtained.  In the left lateral decubitus position, the  Pentax operative scope was inserted blindly into the esophagus and  advanced under direct visualization.  The stomach was entered.  Pylorus  was identified and passed.  Immediately upon passing the pyloric channel  into the duodenum, we were able to see a very large 2-cm duodenal ulcer  in the anterior wall, was not actively bleeding.  There was no visible  vessel.  The scope was withdrawn back into the stomach.  The stomach was  free of ulceration or inflammation.  A biopsy of the antrum was taken  for rapid urease test for Helicobacter.  Fundus and cardia seen well on  retroflex view were normal.  The distal and proximal esophagus were  endoscopically normal upon removal.  The scope was withdrawn.  The  patient tolerated the procedure well.   ASSESSMENT:  Duodenal ulcer in the anterior wall without active  bleeding.   PLAN:  We will start the patient on omeprazole and follow back up in the  office in 6-8 weeks.           ______________________________  Llana Aliment Malon Kindle., M.D.     Waldron Session  D:  07/14/2008  T:  07/14/2008  Job:  [id number]   cc:   Massie Maroon, MD

## 2010-06-12 NOTE — Cardiovascular Report (Signed)
NAMEEGYPT, MARCHIANO              ACCOUNT NO.:  [id number]   MEDICAL RECORD NO.:  [id number]          PATIENT TYPE:  INP   LOCATION:  2010                         FACILITY:  [facility]   PHYSICIAN:  Vesta Mixer, M.D. DATE OF BIRTH:  [birth year]   DATE OF PROCEDURE:  09/30/2007  DATE OF DISCHARGE:                            CARDIAC CATHETERIZATION   Michael Donaldson is a 76 year old gentleman with a recent onset of chest  pain.  He was found to have an inferior defect by Cardiolite scanning.  He is referred for heart catheterization.  Dr. Reyes Ivan has performed  heart catheterization in a separate dictation.  He is found to have at  least moderate-to-severe disease involving the left anterior descending  artery.  We are asked to perform an intracoronary ultrasound for further  clarification of his anatomy.   This 5-French catheter was traded out for 6-French guide.  The patient  was given Angiomax.  His ACT was 306.   At [place] angioplasty wire was positioned down to the distal left  anterior descending artery.  An ultrasound catheter was positioned down  into the distal left anterior descending artery and a pullback was  performed at 0.5 mm/sec.   FINDINGS:  The patient was found to have significant diffuse disease.  The distal LAD measures around 3 mm in diameter.  There were several  napkin-ring stenoses that completely enveloped the ultrasound catheter  which is 1-mm diameter.  The LAD then opened up to be about 3.5 mm  diameter just proximal to the area in question.   These findings confirmed the fact that the patient does have diffuse and  very heavily calcified coronary artery irregularities in the proximal  LAD at the takeoff of the diagonal arteries.  The stenosis are very  calcified and represent approximately 70% occlusion.  These lesions are  not amenable to angioplasty at all because of the trifurcation and  because of the heavily calcified vessels.  We will ask the  surgeons to  the consult for coronary artery bypass grafting.  A followup angiogram  after this revealed good flow and no adverse effects from ultrasound.           ______________________________  Vesta Mixer, M.D.     PJN/MEDQ  D:  09/30/2007  T:  10/01/2007  Job:  [id number]   cc:   Evelene Croon, M.D.  Elmore Guise., M.D.

## 2010-10-31 LAB — CBC
HCT: 25.1 — ABNORMAL LOW
HCT: 26.7 — ABNORMAL LOW
HCT: 27.5 — ABNORMAL LOW
HCT: 27.6 — ABNORMAL LOW
HCT: 28.2 — ABNORMAL LOW
HCT: 29.9 — ABNORMAL LOW
HCT: 38.8 — ABNORMAL LOW
HCT: 43.2
Hemoglobin: 10.1 — ABNORMAL LOW
Hemoglobin: 13.3
Hemoglobin: 9.2 — ABNORMAL LOW
Hemoglobin: 9.4 — ABNORMAL LOW
Hemoglobin: 9.7 — ABNORMAL LOW
Hemoglobin: 9.8 — ABNORMAL LOW
MCHC: 33.3
MCHC: 34
MCHC: 34
MCV: 91.7
MCV: 93.1
MCV: 94.3
MCV: 94.4
MCV: 94.5
Platelets: 136 — ABNORMAL LOW
Platelets: 216
Platelets: 250
RBC: 2.65 — ABNORMAL LOW
RBC: 2.91 — ABNORMAL LOW
RBC: 3.07 — ABNORMAL LOW
RBC: 4.23
RBC: 4.58
RDW: 12.9
RDW: 13.1
RDW: 13.8
RDW: 13.9
WBC: 10.4
WBC: 10.7 — ABNORMAL HIGH
WBC: 12.7 — ABNORMAL HIGH
WBC: 8.6
WBC: 8.6
WBC: 9.6

## 2010-10-31 LAB — BASIC METABOLIC PANEL
BUN: 17
BUN: 18
CO2: 22
CO2: 23
CO2: 27
Calcium: 7.7 — ABNORMAL LOW
Calcium: 8.8
Calcium: 9.2
Chloride: 105
Chloride: 108
Chloride: 111
Chloride: 112
GFR calc Af Amer: >60
GFR calc Af Amer: >60
GFR calc Af Amer: >60
GFR calc non Af Amer: >60
GFR calc non Af Amer: >60
GFR calc non Af Amer: >60
GFR calc non Af Amer: >60
Glucose, Bld: 110 — ABNORMAL HIGH
Glucose, Bld: 138 — ABNORMAL HIGH
Glucose, Bld: 96
Potassium: 3.5
Potassium: 4.1
Potassium: 4.1
Potassium: 4.3
Potassium: 4.3
Sodium: 137
Sodium: 138
Sodium: 139
Sodium: 140
Sodium: 141

## 2010-10-31 LAB — POCT I-STAT, CHEM 8
BUN: 11
BUN: 17
Chloride: 102
Chloride: 109
Creatinine, Ser: 1
HCT: 16 — ABNORMAL LOW
Potassium: 3.9
Potassium: 4.7
Sodium: 141
TCO2: 20

## 2010-10-31 LAB — POCT I-STAT 4, (NA,K, GLUC, HGB,HCT)
Glucose, Bld: 112 — ABNORMAL HIGH
HCT: 23 — ABNORMAL LOW
HCT: 31 — ABNORMAL LOW
Hemoglobin: 11.6 — ABNORMAL LOW
Hemoglobin: 7.8 — CL
Potassium: 3.9
Potassium: 4
Potassium: 4.7
Potassium: 5
Sodium: 138
Sodium: 140

## 2010-10-31 LAB — CARDIAC PANEL(CRET KIN+CKTOT+MB+TROPI)
CK, MB: 1.4
Relative Index: INVALID
Total CK: 86

## 2010-10-31 LAB — CREATININE, SERUM: GFR calc Af Amer: >60

## 2010-10-31 LAB — GLUCOSE, CAPILLARY
Glucose-Capillary: 104 — ABNORMAL HIGH
Glucose-Capillary: 135 — ABNORMAL HIGH
Glucose-Capillary: 137 — ABNORMAL HIGH
Glucose-Capillary: 141 — ABNORMAL HIGH
Glucose-Capillary: 155 — ABNORMAL HIGH
Glucose-Capillary: 156 — ABNORMAL HIGH
Glucose-Capillary: 90

## 2010-10-31 LAB — LIPID PANEL
Cholesterol: 139
HDL: 40
Total CHOL/HDL Ratio: 3.5
Triglycerides: 122

## 2010-10-31 LAB — APTT
aPTT: 28
aPTT: 35

## 2010-10-31 LAB — POCT I-STAT 3, ART BLOOD GAS (G3+)
Acid-base deficit: 2
Acid-base deficit: 3 — ABNORMAL HIGH
Bicarbonate: 22.2
Bicarbonate: 22.6
O2 Saturation: 100
TCO2: 23
TCO2: 24
pCO2 arterial: 27 — ABNORMAL LOW
pCO2 arterial: 32.3 — ABNORMAL LOW
pH, Arterial: 7.522 — ABNORMAL HIGH
pO2, Arterial: 121 — ABNORMAL HIGH
pO2, Arterial: 145 — ABNORMAL HIGH
pO2, Arterial: 327 — ABNORMAL HIGH

## 2010-10-31 LAB — URINE MICROSCOPIC-ADD ON

## 2010-10-31 LAB — TYPE AND SCREEN

## 2010-10-31 LAB — PLATELET COUNT: Platelets: 185

## 2010-10-31 LAB — COMPREHENSIVE METABOLIC PANEL
AST: 17
Alkaline Phosphatase: 76
Calcium: 9.8
Chloride: 110
Creatinine, Ser: 0.98
GFR calc Af Amer: >60
Glucose, Bld: 98
Potassium: 4.2
Total Protein: 7.2

## 2010-10-31 LAB — BLOOD GAS, ARTERIAL
Acid-base deficit: 2.4 — ABNORMAL HIGH
Bicarbonate: 21.7
O2 Saturation: 94.1
Patient temperature: 98.6
TCO2: 22.8

## 2010-10-31 LAB — URINALYSIS, ROUTINE W REFLEX MICROSCOPIC
Glucose, UA: NEGATIVE
Ketones, ur: NEGATIVE
Nitrite: NEGATIVE
Specific Gravity, Urine: 1.037 — ABNORMAL HIGH
Urobilinogen, UA: 0.2

## 2010-10-31 LAB — HEMOGLOBIN AND HEMATOCRIT, BLOOD
HCT: 25.3 — ABNORMAL LOW
Hemoglobin: 8.6 — ABNORMAL LOW

## 2010-10-31 LAB — PROTIME-INR
INR: 1.5
Prothrombin Time: 18.5 — ABNORMAL HIGH

## 2010-10-31 LAB — PREPARE PLATELET PHERESIS

## 2010-10-31 LAB — MAGNESIUM: Magnesium: 3 — ABNORMAL HIGH

## 2010-10-31 LAB — PREPARE RBC (CROSSMATCH)

## 2010-12-26 ENCOUNTER — Telehealth: Payer: Self-pay | Admitting: Cardiovascular Disease

## 2010-12-26 NOTE — Telephone Encounter (Signed)
New problem Pt wants refill of nitroglycerin pills Pharmacy is [facility] [phone] He wants to get today if possible

## 2010-12-28 ENCOUNTER — Other Ambulatory Visit: Payer: Self-pay | Admitting: *Deleted

## 2010-12-28 MED ORDER — NITROGLYCERIN 0.4 MG SL SUBL: 0.4000 mg | SUBLINGUAL_TABLET | SUBLINGUAL | Status: DC | PRN

## 2010-12-28 NOTE — Telephone Encounter (Signed)
Spoke to patient for about which pharmacy that he use. Pt states that he already had his Nitro refilled and upset that it took two days for him to get the call about his Nitro. I Spoke to Dr. Elease Hashimoto about the situation. Pt still did not give me the name of his pharmacy.

## 2010-12-28 NOTE — Telephone Encounter (Signed)
Fax Received. Refill Completed. Michael Donaldson (M.A)  

## 2011-02-11 ENCOUNTER — Other Ambulatory Visit: Payer: Self-pay | Admitting: *Deleted

## 2011-02-11 MED ORDER — METOPROLOL TARTRATE 25 MG PO TABS: 25.0000 mg | ORAL_TABLET | Freq: Two times a day (BID) | ORAL | Status: DC

## 2011-02-11 NOTE — Telephone Encounter (Signed)
Fax Received. Refill Completed. Audrinna Sherman Chowoe (R.M.A)   

## 2011-02-28 ENCOUNTER — Encounter: Payer: Self-pay | Admitting: *Deleted

## 2011-02-28 DIAGNOSIS — G4733 Obstructive sleep apnea (adult) (pediatric): Secondary | ICD-10-CM | POA: Insufficient documentation

## 2011-02-28 DIAGNOSIS — I2510 Atherosclerotic heart disease of native coronary artery without angina pectoris: Secondary | ICD-10-CM | POA: Insufficient documentation

## 2011-02-28 DIAGNOSIS — M1990 Unspecified osteoarthritis, unspecified site: Secondary | ICD-10-CM | POA: Insufficient documentation

## 2011-02-28 DIAGNOSIS — F419 Anxiety disorder, unspecified: Secondary | ICD-10-CM | POA: Insufficient documentation

## 2011-02-28 DIAGNOSIS — E785 Hyperlipidemia, unspecified: Secondary | ICD-10-CM | POA: Insufficient documentation

## 2011-03-12 ENCOUNTER — Ambulatory Visit (INDEPENDENT_AMBULATORY_CARE_PROVIDER_SITE_OTHER): Payer: Medicare Other | Admitting: Cardiovascular Disease

## 2011-03-12 ENCOUNTER — Encounter: Payer: Self-pay | Admitting: Cardiovascular Disease

## 2011-03-12 DIAGNOSIS — I2510 Atherosclerotic heart disease of native coronary artery without angina pectoris: Secondary | ICD-10-CM

## 2011-03-12 DIAGNOSIS — E785 Hyperlipidemia, unspecified: Secondary | ICD-10-CM

## 2011-03-12 DIAGNOSIS — I10 Essential (primary) hypertension: Secondary | ICD-10-CM

## 2011-03-12 NOTE — Progress Notes (Signed)
[name] Date of Birth  April 21, 1934 [facility]     [place] Office  [address]    [address]   [address] [place], [place]  [address]    [place], [place]  [address] [phone]  Fax  [phone]  [phone]  Fax [phone]  Problems: 1. Coronary artery disease-status post CABG ( 2009) 2. Dyslipidemia 3. Hypertension 4. Arthritis 5. Sleep Apnea   History of Present Illness:  Loistine Chance has done well over the past year.  He is not getting much exercise.  He does not sleep well at night.  He did not like how Ambien made him feel.  He denies any chest pain or dyspnea.  Current Outpatient Prescriptions on File Prior to Visit  Medication Sig Dispense Refill  . aspirin 81 MG tablet Take 81 mg by mouth daily. Taking 4 daily      . Calcium Carbonate-Vitamin D (CALCIUM + D PO) Take 2 tablets by mouth daily.        Marland Kitchen docusate sodium (COLACE) 50 MG capsule Take by mouth 2 (two) times daily. As needed      . Flaxseed, Linseed, (FLAX SEEDS PO) Take by mouth daily.      Marland Kitchen lisinopril (PRINIVIL,ZESTRIL) 5 MG tablet Take 5 mg by mouth daily.        . metoprolol tartrate (LOPRESSOR) 25 MG tablet Take 1 tablet (25 mg total) by mouth 2 (two) times daily.  60 tablet  0  . nitroGLYCERIN (NITROSTAT) 0.4 MG SL tablet Place 1 tablet (0.4 mg total) under the tongue every 5 (five) minutes as needed.  25 tablet  11  . Pomegranate, Punica granatum, (POMEGRANATE PO) Take by mouth daily.      . Sildenafil Citrate (VIAGRA PO) Take by mouth. As directed      . zolpidem (AMBIEN) 10 MG tablet Take 10 mg by mouth at bedtime as needed.      crestor 10 mg a week   Allergies  Allergen Reactions  . Lipitor (Atorvastatin Calcium)     Muscle Aches  . Lovastatin     Muscle Aches    Past Medical History  Diagnosis Date  . Coronary artery disease     cabg  . Dyslipidemia   . Hypertension   . Arthritis   . Anxiety   . Obstructive sleep apnea     Past Surgical History  Procedure  Date  . Cardiac catheterization 09/30/2007    cabg x 4  . Appendectomy   . Other surgical history     tonsilectomy  . Coronary artery bypass graft 10/02/2007    x 4    History  Smoking status  . Former Smoker  . Types: Cigarettes  Smokeless tobacco  . Not on file    History  Alcohol Use     Family History  Problem Relation Age of Onset  . Cancer Mother     colon  . Heart attack Father   . Heart attack Brother     Reviw of Systems:  Reviewed in the HPI.  All other systems are negative.  Physical Exam: Blood pressure 141/72, pulse 52, height 5\' 6"  (1.676 m), weight 173 lb 12.8 oz (78.835 kg). General: Well developed, well nourished, in no acute distress.  Head: Normocephalic, atraumatic, sclera non-icteric, mucus membranes are moist,   Neck: Supple. Negative for carotid bruits. JVD not elevated.  Lungs: Clear bilaterally to auscultation without wheezes, rales, or rhonchi. Breathing is unlabored.  Heart:  RRR with S1 S2. No murmurs, rubs, or gallops appreciated.  Abdomen: Soft, non-tender, non-distended with normoactive bowel sounds. No hepatomegaly. No rebound/guarding. No obvious abdominal masses.  Msk:  Strength and tone appear normal for age.  Extremities: No clubbing or cyanosis. No edema.  Distal pedal pulses are 2+ and equal bilaterally.  Neuro: Alert and oriented X 3. Moves all extremities spontaneously.  Psych:  Responds to questions appropriately with a normal affect.  ECG: Sinus bradycardia, normal ecg   Assessment / Plan:

## 2011-03-12 NOTE — Assessment & Plan Note (Signed)
Michael Donaldson is doing well.  He  is not having episodes of chest pain or shortness breath.  I will see him again in 1 year for an OV and fasting labs

## 2011-03-12 NOTE — Patient Instructions (Signed)
Your physician wants you to follow-up in: 1 year You will receive a reminder letter in the mail two months in advance. If you don't receive a letter, please call our office to schedule the follow-up appointment.  Your physician recommends that you return for a FASTING lipid profile:1 year  PLEASE PROVIDE [place] WITH LABS FROM YOUR PRIMARY DOCTOR TO HELP COMPLETE OUR CHART, THANK YOU.

## 2011-03-18 ENCOUNTER — Other Ambulatory Visit: Payer: Self-pay | Admitting: *Deleted

## 2011-03-18 MED ORDER — METOPROLOL TARTRATE 25 MG PO TABS: 25.0000 mg | ORAL_TABLET | Freq: Two times a day (BID) | ORAL | Status: DC

## 2011-03-19 ENCOUNTER — Other Ambulatory Visit: Payer: Self-pay | Admitting: *Deleted

## 2011-05-29 ENCOUNTER — Telehealth: Payer: Self-pay | Admitting: Cardiovascular Disease

## 2011-05-29 NOTE — Telephone Encounter (Signed)
Pt applied for life insurance and was denied because he had bypass on Sept 4th 2009 and per insurance the wording they used was no cardiac testing since 2011 pt had a prosthusian scan that showed up abnormal and it lead them to believe that he had a blockage but Dr. Elease Hashimoto might have thought it may have been from pt bypass and it was ok but the situation needs to be confirmed.

## 2011-05-30 NOTE — Telephone Encounter (Signed)
Reviewed chart/ paper and emr. Spoke with pt he thinks the insurance company wants him to have another stress test.  He was going out of town currently. He will call me next week with the phone number of the company so i can call and find out what they need. Pt agreed to plan.

## 2011-06-03 NOTE — Telephone Encounter (Signed)
Fu call Pt called back Underwriting dept phone number [phone]. Please call him back with info

## 2011-06-04 NOTE — Telephone Encounter (Signed)
I called life ins co, [employer] [place]. Spoke with Thayer Ohm in the [employer], He stated he could not speak to me due to HIPPA, he said pt needs to put into writting his concerns and mail them in. I called pt and discussed options, reviewed his records/ tests with him and he will continue process from here.

## 2011-08-30 IMAGING — US US ABDOMEN COMPLETE
1 series · 14 of 25 positions shown · non-contrast
Comparison: CT abdomen pelvis of 06/03/2008

CLINICAL DATA: Abdominal pain

COMPLETE ABDOMINAL ULTRASOUND

[Series 1: us abdomen complete · 0.33mm/px · 14 of 68 slices shown]
[im 1/68]
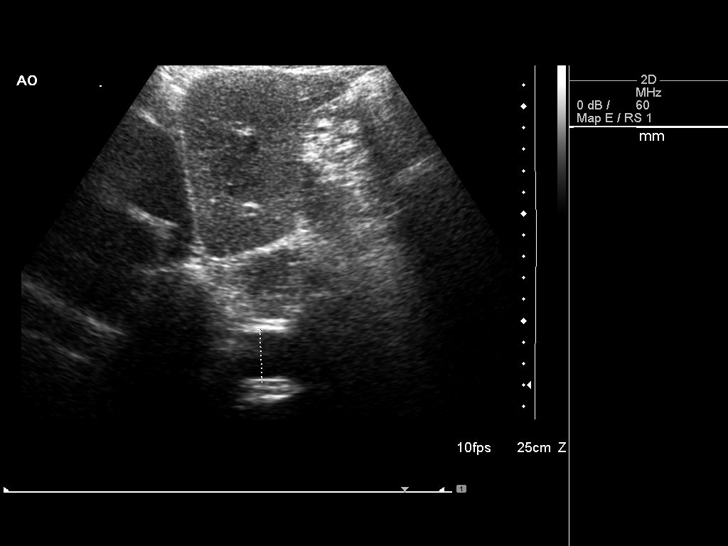
[im 6/68]
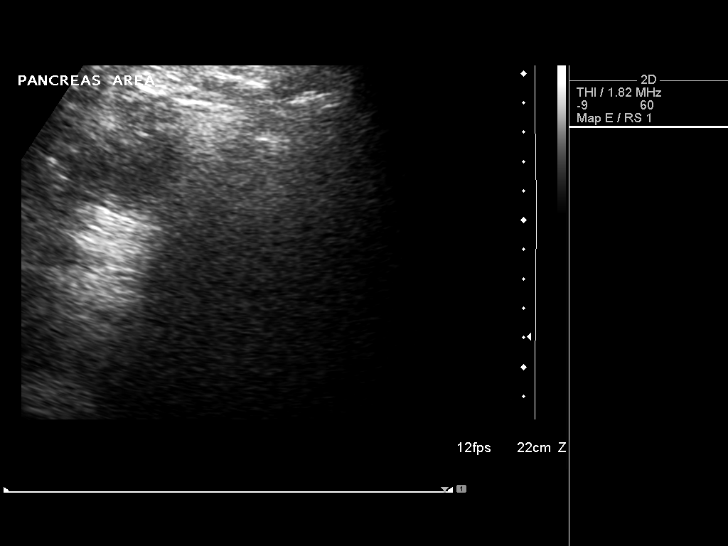
[im 12/68]
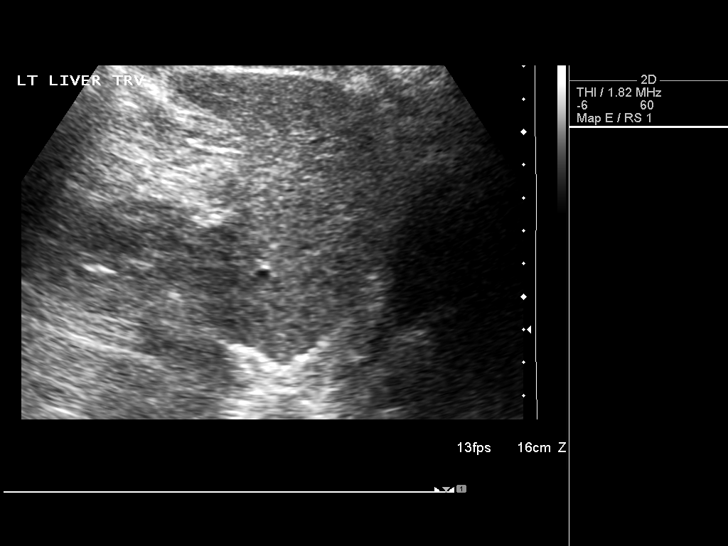
[im 17/68]
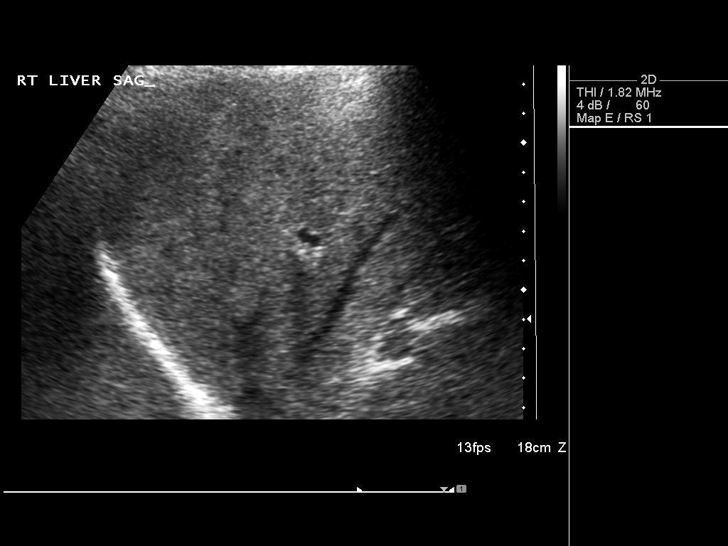
[im 23/68]
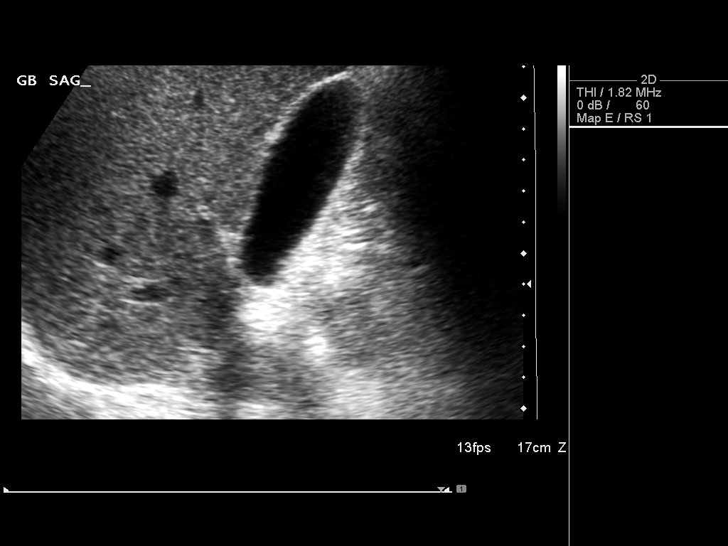
[im 26/68]
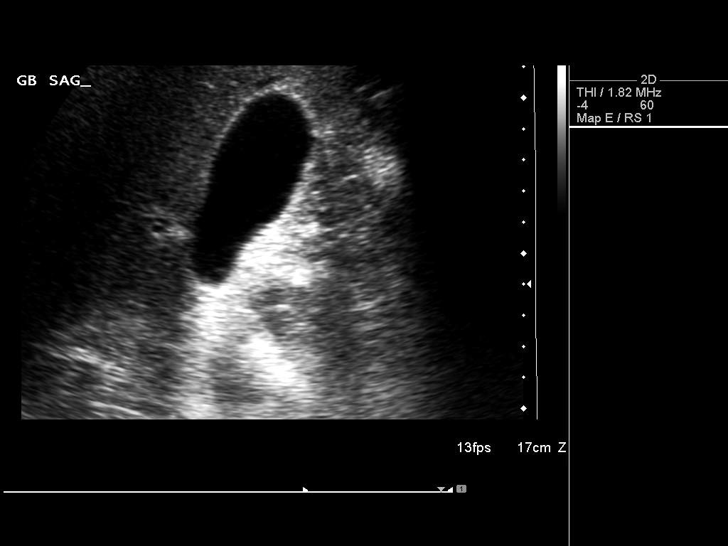
[im 31/68]
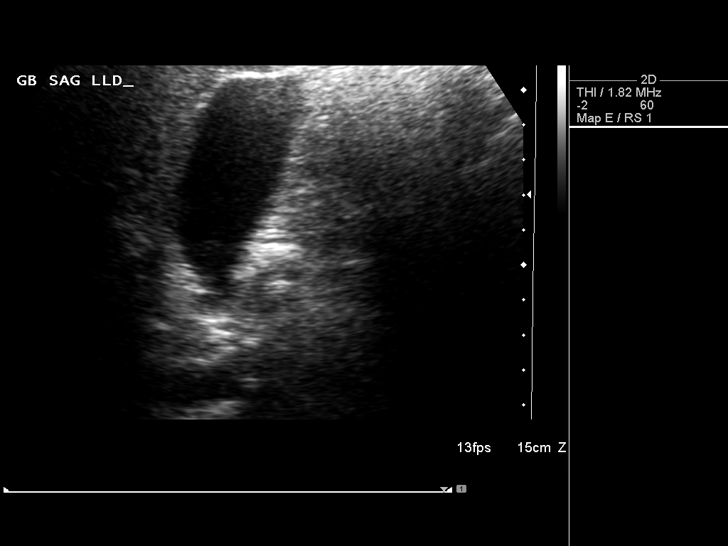
[im 37/68]
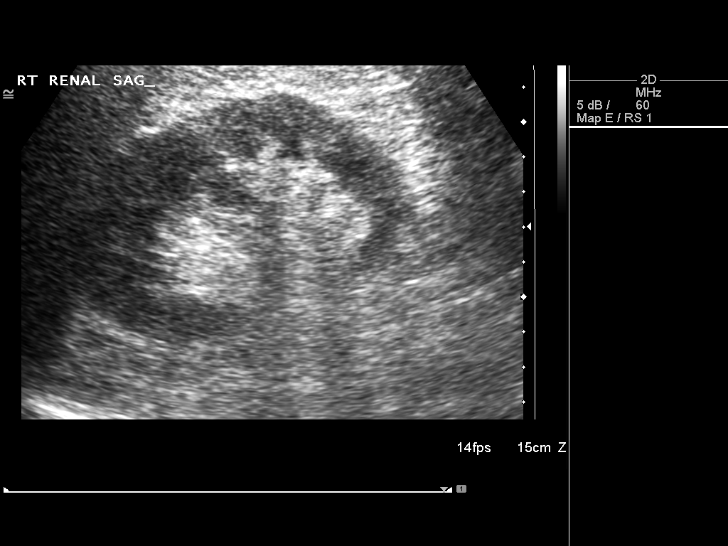
[im 42/68]
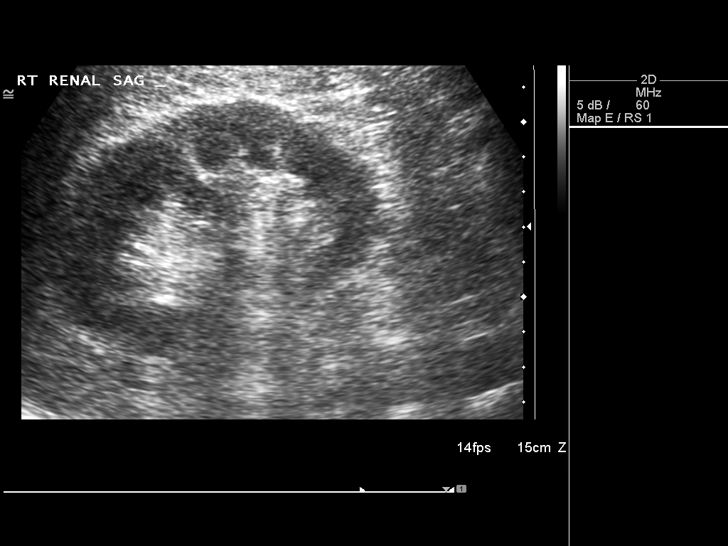
[im 45/68]
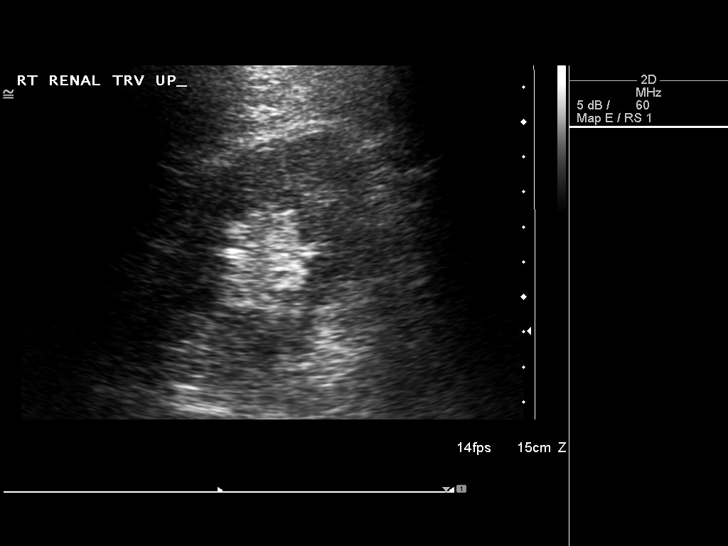
[im 51/68]
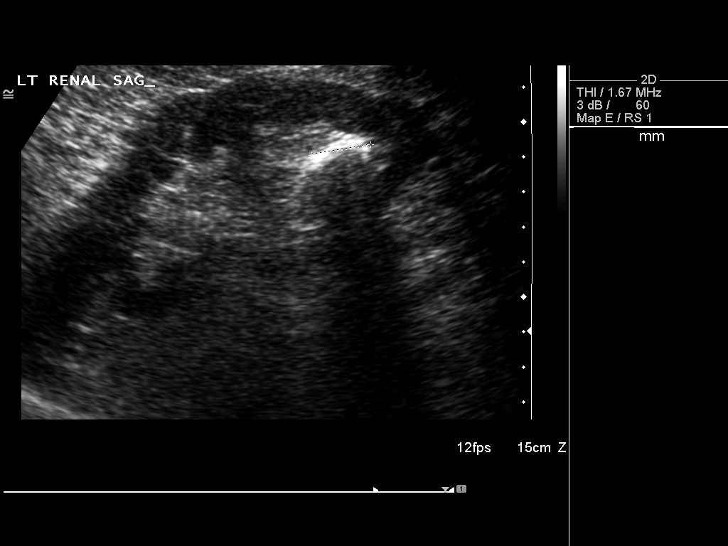
[im 56/68]
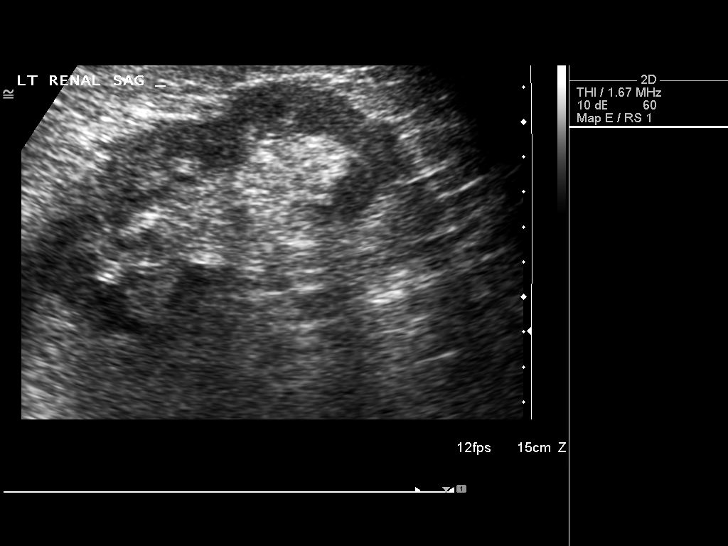
[im 62/68]
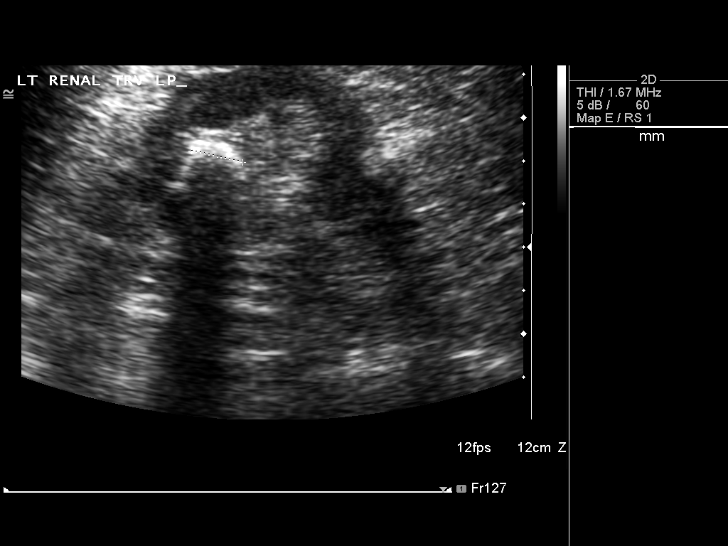
[im 68/68]
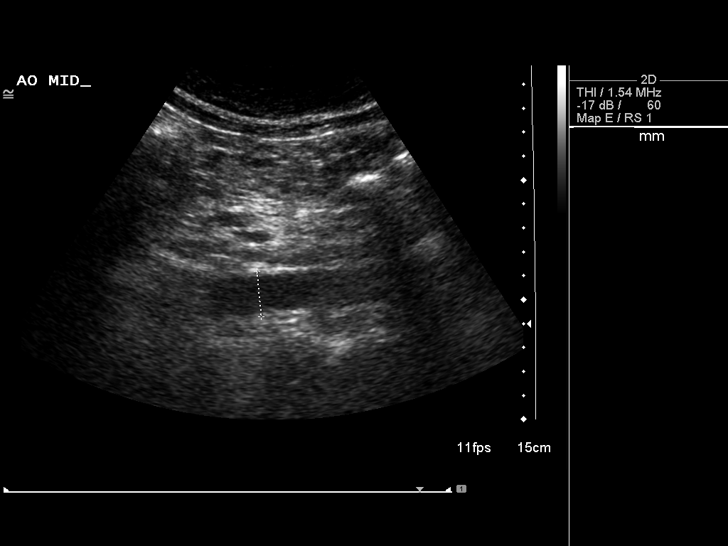

[14 of 25 positions shown; findings below may reference images not displayed]

FINDINGS: Gallbladder:  The gallbladder is visualized and no gallstones are
noted.

Common bile duct:  The common bile duct is normal measuring 3.1 mm
in diameter.

Liver:  The liver has a normal echogenic pattern with only minimal
in homogeneity.  No ductal dilatation is seen.

IVC:  The IVC partially obscured by bowel gas.

Pancreas:  The pancreas is largely obscured by bowel gas.

Spleen:  The spleen is normal measuring 5.9 cm sagittally.

Right Kidney:  No hydronephrosis is seen.  The right kidney
measures 10.9 cm sagittally.

Left Kidney:  No hydronephrosis.  The left kidney measures 10.9 cm.
An echogenic focus in the lower pole of the left kidney is
consistent with a kidney stone of 1.7 cm which is non obstructing.

Abdominal aorta:  The abdominal aorta is normal in caliber.
IMPRESSION: 1.  No gallstones.  No ductal dilatation.
2.  The pancreas is largely obscured by bowel gas.
3.  Nonobstructing 1.7 cm lower pole left renal calculus.

## 2011-09-18 ENCOUNTER — Other Ambulatory Visit: Payer: Self-pay | Admitting: *Deleted

## 2011-09-18 ENCOUNTER — Other Ambulatory Visit: Payer: Self-pay | Admitting: Cardiovascular Disease

## 2011-09-18 MED ORDER — METOPROLOL TARTRATE 25 MG PO TABS: 25.0000 mg | ORAL_TABLET | Freq: Two times a day (BID) | ORAL | Status: DC

## 2011-09-18 NOTE — Telephone Encounter (Signed)
Refilled metoprolol 

## 2011-09-18 NOTE — Telephone Encounter (Signed)
Fax Received. Refill Completed. Rees Matura Chowoe (R.M.A)   

## 2011-10-09 ENCOUNTER — Encounter: Payer: Self-pay | Admitting: Cardiovascular Disease

## 2011-10-15 ENCOUNTER — Telehealth: Payer: Self-pay | Admitting: *Deleted

## 2011-10-15 NOTE — Telephone Encounter (Signed)
labs were ok

## 2012-02-28 ENCOUNTER — Encounter: Payer: Self-pay | Admitting: Nurse Practitioner

## 2012-02-28 ENCOUNTER — Ambulatory Visit (INDEPENDENT_AMBULATORY_CARE_PROVIDER_SITE_OTHER): Payer: Medicare Other | Admitting: Nurse Practitioner

## 2012-02-28 VITALS — BP 130/68 | HR 56 | Ht 66.0 in | Wt 176.8 lb

## 2012-02-28 DIAGNOSIS — I259 Chronic ischemic heart disease, unspecified: Secondary | ICD-10-CM

## 2012-02-28 NOTE — Patient Instructions (Addendum)
We are going to arrange for a Lexiscan  For now, stay on your current medicines with no changes  See Dr. Elease Hashimoto in one month  Call the [facility] office at [phone] if you have any questions, problems or concerns.

## 2012-02-28 NOTE — Progress Notes (Signed)
Michael Donaldson Date of Birth: Jul 15, 1934 Medical Record #[id number]  History of Present Illness: Michael Donaldson is seen back today for a work in visit. He is seen for Dr. Elease Hashimoto. He has not been seen here since February of 2013. He has CAD with prior CABG x 4 back in 2009 (LIMA to LAD, SVG to CX marginal, SVG to acute marginal and SVG to distal RCA), HLD, HTN, OA and sleep apnea. Last stress test was in 2011 which showed a moderate area of infarct in the inferior apical wall. LV function was normal with an EF of 77%. The study was unchanged from his study of 2009.   He was last seen a year ago and was doing ok.   He comes in today. He is here alone. He is doing ok, or so he thought. He has been having more issues with fatigue and no energy. He feels this is related to his sleep apnea which has apparently gotten worse. He has not tolerated CPAP in the past but will be retrying next week. He went to the eye doctor earlier this week. HR was 50. Sent to Dr. Selena Batten who cut his metoprolol back. He is not lightheaded or dizzy. He has had no syncope. He has never had chest pain even prior to his CABG. He wakes up tired every morning. He is limited by back pain. He does not exercise.   Current Outpatient Prescriptions on File Prior to Visit  Medication Sig Dispense Refill  . aspirin 81 MG tablet Take 81 mg by mouth daily. Taking 4 daily      . Calcium Carbonate-Vitamin D (CALCIUM + D PO) Take 2 tablets by mouth daily.        Marland Kitchen docusate sodium (COLACE) 50 MG capsule Take by mouth 2 (two) times daily. As needed      . Flaxseed, Linseed, (FLAX SEEDS PO) Take by mouth daily.      Marland Kitchen HYDROCODONE-ACETAMINOPHEN PO Take by mouth as needed.      Marland Kitchen lisinopril (PRINIVIL,ZESTRIL) 5 MG tablet Take 5 mg by mouth daily.        . metoprolol tartrate (LOPRESSOR) 25 MG tablet Take 1 tablet (25 mg total) by mouth 2 (two) times daily.  180 tablet  3  . Multiple Vitamin (MULTIVITAMIN) capsule Take 1 capsule by mouth daily.      .  nitroGLYCERIN (NITROSTAT) 0.4 MG SL tablet Place 1 tablet (0.4 mg total) under the tongue every 5 (five) minutes as needed.  25 tablet  11  . omeprazole (PRILOSEC) 20 MG capsule Take 20 mg by mouth daily.      . Pomegranate, Punica granatum, (POMEGRANATE PO) Take by mouth daily.      . rosuvastatin (CRESTOR) 10 MG tablet Take 10 mg by mouth once a week.      . Sildenafil Citrate (VIAGRA PO) Take by mouth. As directed      . zolpidem (AMBIEN) 10 MG tablet Take 10 mg by mouth at bedtime as needed.        Allergies  Allergen Reactions  . Lipitor (Atorvastatin Calcium)     Muscle Aches  . Lovastatin     Muscle Aches    Past Medical History  Diagnosis Date  . Coronary artery disease     cabg  . Dyslipidemia   . Hypertension   . Arthritis   . Anxiety   . Obstructive sleep apnea     Past Surgical History  Procedure Date  . Cardiac  catheterization 09/30/2007    cabg x 4  . Appendectomy   . Other surgical history     tonsilectomy  . Coronary artery bypass graft 10/02/2007    x 4    History  Smoking status  . Former Smoker  . Types: Cigarettes  Smokeless tobacco  . Not on file    History  Alcohol Use     Family History  Problem Relation Age of Onset  . Cancer Mother     colon  . Heart attack Father   . Heart attack Brother     Review of Systems: The review of systems is per the HPI.  All other systems were reviewed and are negative.  Physical Exam: Ht 5\' 6"  (1.676 m)  Wt 176 lb 12.8 oz (80.196 kg)  BMI 28.54 kg/m2 Weight is up just 3 pounds today.  Patient is very pleasant and in no acute distress. Skin is warm and dry. Color is normal.  HEENT is unremarkable. Normocephalic/atraumatic. PERRL. Sclera are nonicteric. Neck is supple. No masses. No JVD. Lungs are clear. Cardiac exam shows a regular rate and rhythm. Abdomen is soft. Extremities are without edema. Gait and ROM are intact. No gross neurologic deficits noted.  LABORATORY DATA:  Lab Results  Component  Value Date   WBC 10.4 10/06/2007   HGB 10.1* 10/06/2007   HCT 29.9* 10/06/2007   PLT 205 DELTA CHECK NOTED 10/06/2007   GLUCOSE 102* 10/05/2007   CHOL  Value: 139        ATP III CLASSIFICATION:  <200     mg/dL   Desirable  098-119  mg/dL   Borderline High  >=147    mg/dL   High 08/29/9560   TRIG 122 09/30/2007   HDL 40 09/30/2007   LDLCALC  Value: 75        Total Cholesterol/HDL:CHD Risk Coronary Heart Disease Risk Table                     Men   Women  1/2 Average Risk   3.4   3.3 09/30/2007   ALT 15 09/29/2007   AST 17 09/29/2007   NA 136 10/05/2007   K 4.3 HEMOLYZED SPECIMEN, RESULTS MAY BE AFFECTED 10/05/2007   CL 104 10/05/2007   CREATININE 1.06 10/05/2007   BUN 17 10/05/2007   CO2 27 10/05/2007   INR 1.5 10/02/2007   HGBA1C  Value: 5.9 (NOTE)   The ADA recommends the following therapeutic goal for glycemic   control related to Hgb A1C measurement:   Goal of Therapy:   < 7.0% Hgb A1C   Reference: American Diabetes Association: Clinical Practice   Recommendations 2008, Diabetes Care,  2008, 31:(Suppl 1). 10/01/2007     Assessment / Plan: 1. CAD - past CABG - last stress study in 2011 - never had a chest pain syndrome. Now almost 5 years out from surgery. Will update his Myoview. He is not able to walk on a treadmill. Will arrange for Lexiscan.   2. HTN - blood pressure looks ok.   3. HLD  4. Fatigue - sounds more like this is from his sleep apnea. His heart rate today is 56. He is not having dizzy or passing out spells. I would like his sleep apnea treated to see if his symptoms improved. Will also update his Myoview to rule out ischemia. For now, I have left him on his low dose of beta blocker.   I will get him to see Dr. Elease Hashimoto  in a month.   Patient is agreeable to this plan and will call if any problems develop in the interim.

## 2012-03-02 ENCOUNTER — Other Ambulatory Visit: Payer: Self-pay

## 2012-03-02 MED ORDER — NITROGLYCERIN 0.4 MG SL SUBL: 0.4000 mg | SUBLINGUAL_TABLET | SUBLINGUAL | Status: DC | PRN

## 2012-03-09 ENCOUNTER — Encounter (HOSPITAL_COMMUNITY): Payer: Medicare Other

## 2012-03-23 ENCOUNTER — Ambulatory Visit (HOSPITAL_COMMUNITY): Payer: Medicare Other | Attending: Cardiovascular Disease | Admitting: Radiology

## 2012-03-23 VITALS — BP 132/74 | Ht 66.0 in | Wt 169.0 lb

## 2012-03-23 DIAGNOSIS — R000 Tachycardia, unspecified: Secondary | ICD-10-CM | POA: Insufficient documentation

## 2012-03-23 DIAGNOSIS — R0609 Other forms of dyspnea: Secondary | ICD-10-CM | POA: Insufficient documentation

## 2012-03-23 DIAGNOSIS — R002 Palpitations: Secondary | ICD-10-CM | POA: Insufficient documentation

## 2012-03-23 DIAGNOSIS — R0789 Other chest pain: Secondary | ICD-10-CM | POA: Insufficient documentation

## 2012-03-23 DIAGNOSIS — I259 Chronic ischemic heart disease, unspecified: Secondary | ICD-10-CM | POA: Insufficient documentation

## 2012-03-23 DIAGNOSIS — R5381 Other malaise: Secondary | ICD-10-CM | POA: Insufficient documentation

## 2012-03-23 DIAGNOSIS — E785 Hyperlipidemia, unspecified: Secondary | ICD-10-CM | POA: Insufficient documentation

## 2012-03-23 DIAGNOSIS — I2510 Atherosclerotic heart disease of native coronary artery without angina pectoris: Secondary | ICD-10-CM

## 2012-03-23 DIAGNOSIS — Z951 Presence of aortocoronary bypass graft: Secondary | ICD-10-CM | POA: Insufficient documentation

## 2012-03-23 DIAGNOSIS — R0989 Other specified symptoms and signs involving the circulatory and respiratory systems: Secondary | ICD-10-CM | POA: Insufficient documentation

## 2012-03-23 DIAGNOSIS — R0602 Shortness of breath: Secondary | ICD-10-CM

## 2012-03-23 DIAGNOSIS — I10 Essential (primary) hypertension: Secondary | ICD-10-CM | POA: Insufficient documentation

## 2012-03-23 DIAGNOSIS — R079 Chest pain, unspecified: Secondary | ICD-10-CM

## 2012-03-23 MED ORDER — TECHNETIUM TC 99M SESTAMIBI GENERIC - CARDIOLITE
30.0000 | Freq: Once | INTRAVENOUS | Status: AC | PRN
  Administered 2012-03-23: 30 via INTRAVENOUS

## 2012-03-23 MED ORDER — AMINOPHYLLINE 25 MG/ML IV SOLN
75.0000 mg | Freq: Once | INTRAVENOUS | Status: AC
  Administered 2012-03-23: 75 mg via INTRAVENOUS

## 2012-03-23 MED ORDER — TECHNETIUM TC 99M SESTAMIBI GENERIC - CARDIOLITE
10.0000 | Freq: Once | INTRAVENOUS | Status: AC | PRN
  Administered 2012-03-23: 10 via INTRAVENOUS

## 2012-03-23 MED ORDER — REGADENOSON 0.4 MG/5ML IV SOLN
0.4000 mg | Freq: Once | INTRAVENOUS | Status: AC
  Administered 2012-03-23: 0.4 mg via INTRAVENOUS

## 2012-03-23 NOTE — Progress Notes (Signed)
[facility] SITE 3 NUCLEAR MED [address] [place], [place] [address] [phone]    Cardiology Nuclear Med Study  Michael Donaldson is a 78 y.o. male     MRN : [id number]     DOB: 1934/11/14  Procedure Date: 03/23/2012  Nuclear Med Background Indication for Stress Test:  Evaluation for Ischemia and Graft Patency History:  '09 Heart Catheterization>CABG x 4;'09 Echo: EF=60%;'11MPS:EF=77% ,inferior infarct Cardiac Risk Factors: Family History - CAD, History of Smoking, Hypertension and Lipids  Symptoms:  Chest Pain (last date of chest discomfort 2-3 days ago), DOE, Fatigue, Palpitations and Rapid HR   Nuclear Pre-Procedure Caffeine/Decaff Intake:  7:00pm NPO After: 9:30pm   Lungs:  clear O2 Sat: 99% on room air. IV 0.9% NS with Angio Cath:  22g  IV Site: R Hand  IV Started by:  Cathlyn Parsons, RN  Chest Size (in):  42 Cup Size: n/a  Height: 5\' 6"  (1.676 m)  Weight:  169 lb (76.658 kg)  BMI:  Body mass index is 27.29 kg/(m^2). Tech Comments:  Metoprolol held x 16 hrs.    Nuclear Med Study 1 or 2 day study: 1 day  Stress Test Type:  Treadmill/Lexiscan  Reading MD: Charlton Haws, MD  Order Authorizing Provider:  Jannette Spanner  Resting Radionuclide: Technetium 98m Sestamibi  Resting Radionuclide Dose: 10.8 mCi   Stress Radionuclide:  Technetium 42m Sestamibi  Stress Radionuclide Dose: 33.0 mCi           Stress Protocol Rest HR: 65 Stress HR: 136  Rest BP: 132/74 Stress BP: 149/84  Exercise Time (min):  METS: 1.4   Predicted Max HR: 145 bpm % Max HR: 97.93 bpm Rate Pressure Product: 24401   Dose of Adenosine (mg):  n/a Dose of Lexiscan: 0.4 mg  Dose of Atropine (mg): n/a Dose of Dobutamine: n/a mcg/kg/min (at max HR)  Stress Test Technologist: Cathlyn Parsons, RN  Nuclear Technologist:  Domenic Polite, CNMT     Rest Procedure:  Myocardial perfusion imaging was performed at rest 45 minutes following the intravenous administration of  Technetium 36m Sestamibi. Rest ECG: Inferolateral ischemic changes  Stress Procedure:  The patient received IV Lexiscan 0.4 mg over 15-seconds with concurrent low level exercise and then Technetium 63m Sestamibi was injected at 30-seconds while the patient continued walking 30 secs. Patient had chest tightness 10/10 with infusion. Patient continued having chest tightness 10/10 at 1 minute of recovery. NTG 0.4mg  SL given. Patient had arm pain and tingle feeling. Patient continued in recovery to have chest tightness and pain. Aminophylline 75 mg IV given at 7 minutes of recovery. Patient continued to have above symptoms,therefore NTG 0.4mg  SL given at 10 mins of recovery and Aminophylline 75 mg IV given at 20 mins of recovery.Quantitative spect images were obtained after a 45-minute delay. Dr. Elease Hashimoto consulted with EKG and images. Orders given to discharge patient and Dr. Elease Hashimoto would follow up with patient. Stress ECG: Inferolateral ischemia  QPS Raw Data Images:  Normal; no motion artifact; normal heart/lung ratio. Stress Images:  There is decreased uptake in the inferior wall. Rest Images:  There is decreased uptake in the inferior wall. Subtraction (SDS):  Mixed infarct and ischemia Transient Ischemic Dilatation (Normal <1.22):  1.02 Lung/Heart Ratio (Normal <0.45):  0.27  Quantitative Gated Spect Images QGS EDV:  68 ml QGS ESV:  21 ml  Impression Exercise Capacity:  Lexiscan with low level exercise. BP Response:  Normal blood pressure response. Clinical Symptoms:  Significant chest  pain/angina ECG Impression:  Significant ST abnormalities consistent with ischemia. Comparison with Prior Nuclear Study: No images to compare  Overall Impression:  Intermediate stress nuclear study. Marked symptoms consistant with angina and ECG changes. Mid and apical inferior wall Infarct with inferoapical ischemia  LV Ejection Fraction: 69%.  LV Wall Motion:  NL LV Function; NL Wall Motion   Charlton Haws

## 2012-03-31 ENCOUNTER — Encounter: Payer: Self-pay | Admitting: Cardiovascular Disease

## 2012-03-31 ENCOUNTER — Ambulatory Visit (INDEPENDENT_AMBULATORY_CARE_PROVIDER_SITE_OTHER): Payer: Medicare Other | Admitting: Cardiovascular Disease

## 2012-03-31 VITALS — BP 120/76 | HR 66 | Ht 66.0 in | Wt 168.0 lb

## 2012-03-31 DIAGNOSIS — I2510 Atherosclerotic heart disease of native coronary artery without angina pectoris: Secondary | ICD-10-CM

## 2012-03-31 NOTE — Patient Instructions (Addendum)
Your physician wants you to follow-up in: 6 months with ekg  You will receive a reminder letter in the mail two months in advance. If you don't receive a letter, please call our office to schedule the follow-up appointment.  Your physician recommends that you continue on your current medications as directed. Please refer to the Current Medication list given to you today.

## 2012-03-31 NOTE — Assessment & Plan Note (Signed)
Michael Donaldson has a hx of coronary artery disease. He is status post coronary artery bypass grafting in 2001. He had a Myoview study in 2011 and repeat Myoview study several weeks ago was identical. Has a severe inferior wall defect with partial reversibility.  His symptoms do not sound like ischemia. He's having more fatigue and this may be related to sleep apnea. I've encouraged him to continue to look for other causes of his fatigue. He will cause back if he develops any episodes of chest pain. I'll see him again in 6 months.

## 2012-03-31 NOTE — Progress Notes (Signed)
Michael Donaldson Date of Birth  11/08/1934 [facility]     [facility]  [address]    [address]   [address] [place], [place]  [address]    [place], [place]  [address] [phone]  Fax  [phone]  [phone]  Fax [phone]  Problems: 1. Coronary artery disease-status post CABG ( 2009) 2. Dyslipidemia 3. Hypertension 4. Arthritis 5. Sleep Apnea   History of Present Illness:  Michael Donaldson has done well over the past year.  He is not getting much exercise.  He does not sleep well at night.  He did not like how Ambien made him feel.  He denies any chest pain or dyspnea.  March 31, 2012:  He has been having lots of fatigue.  He was seen by Lawson Fiscal and was scheduled for a stress myoview.   The  myoview images look very similar to his previous study in 2011 ( which was done 2 years after his CABG)   He denies any chest pain with exercise.  He does have some random episodes of CP - not associated with deep breath, changes of position, exertion.  He has lots of shoulder arthritis.     Current Outpatient Prescriptions on File Prior to Visit  Medication Sig Dispense Refill  . aspirin 81 MG tablet Take 81 mg by mouth daily. Taking 4 daily      . brimonidine (ALPHAGAN) 0.15 % ophthalmic solution Place 1 drop into both eyes 2 (two) times daily.       Marland Kitchen BUTRANS 10 MCG/HR PTWK Place 10 mcg onto the skin every 3 (three) days.       . Calcium Carbonate-Vitamin D (CALCIUM + D PO) Take 2 tablets by mouth daily.        Marland Kitchen docusate sodium (COLACE) 50 MG capsule Take by mouth 2 (two) times daily. As needed       . Flaxseed, Linseed, (FLAX SEEDS PO) Take by mouth daily.      Marland Kitchen HYDROCODONE-ACETAMINOPHEN PO Take 325 mg by mouth 4 (four) times daily.       Marland Kitchen latanoprost (XALATAN) 0.005 % ophthalmic solution Place 1 drop into the right eye at bedtime.      Marland Kitchen lisinopril (PRINIVIL,ZESTRIL) 5 MG tablet Take 5 mg by mouth daily.        . metoprolol tartrate (LOPRESSOR) 25 MG tablet Take  12.5 mg by mouth 2 (two) times daily.      . Multiple Vitamin (MULTIVITAMIN) capsule Take 1 capsule by mouth daily.      . nitroGLYCERIN (NITROSTAT) 0.4 MG SL tablet Place 1 tablet (0.4 mg total) under the tongue every 5 (five) minutes as needed.  25 tablet  11  . Omega-3 Fatty Acids (OMEGA 3 PO) Take by mouth daily.      Marland Kitchen omeprazole (PRILOSEC) 20 MG capsule Take 20 mg by mouth daily.      . Pomegranate, Punica granatum, (POMEGRANATE PO) Take by mouth daily.      . rosuvastatin (CRESTOR) 10 MG tablet Take 10 mg by mouth once a week.      . Sildenafil Citrate (VIAGRA PO) Take by mouth. As directed       . zolpidem (AMBIEN) 10 MG tablet Take 10 mg by mouth at bedtime as needed.       No current facility-administered medications on file prior to visit.  crestor 10 mg a week   Allergies  Allergen Reactions  . Lipitor (Atorvastatin Calcium)  Muscle Aches  . Lovastatin     Muscle Aches    Past Medical History  Diagnosis Date  . Coronary artery disease     cabg x 4 in 2009  . Dyslipidemia   . Hypertension   . Arthritis   . Anxiety   . Obstructive sleep apnea     Past Surgical History  Procedure Laterality Date  . Cardiac catheterization  09/30/2007    cabg x 4  . Appendectomy    . Other surgical history      tonsilectomy  . Coronary artery bypass graft  10/02/2007    x 4    History  Smoking status  . Former Smoker  . Types: Cigarettes  Smokeless tobacco  . Not on file    History  Alcohol Use No    Family History  Problem Relation Age of Onset  . Cancer Mother     colon  . Heart attack Father   . Heart attack Brother     Reviw of Systems:  Reviewed in the HPI.  All other systems are negative.  Physical Exam: Blood pressure 120/76, pulse 66, height 5\' 6"  (1.676 m), weight 168 lb (76.204 kg), SpO2 98.00%. General: Well developed, well nourished, in no acute distress.  Head: Normocephalic, atraumatic, sclera non-icteric, mucus membranes are moist,   Neck:  Supple. Negative for carotid bruits. JVD not elevated.  Lungs: Clear bilaterally to auscultation without wheezes, rales, or rhonchi. Breathing is unlabored.  Heart: RRR with S1 S2. No murmurs, rubs, or gallops appreciated.  Abdomen: Soft, non-tender, non-distended with normoactive bowel sounds. No hepatomegaly. No rebound/guarding. No obvious abdominal masses.  Msk:  Strength and tone appear normal for age.  Extremities: No clubbing or cyanosis. No edema.  Distal pedal pulses are 2+ and equal bilaterally.  Neuro: Alert and oriented X 3. Moves all extremities spontaneously.  Psych:  Responds to questions appropriately with a normal affect.  ECG: Sinus bradycardia, normal ecg   Assessment / Plan:

## 2012-04-09 ENCOUNTER — Encounter: Payer: Self-pay | Admitting: Cardiovascular Disease

## 2012-04-10 ENCOUNTER — Encounter: Payer: Self-pay | Admitting: Cardiovascular Disease

## 2012-04-10 ENCOUNTER — Encounter: Payer: Self-pay | Admitting: *Deleted

## 2012-10-02 ENCOUNTER — Ambulatory Visit (INDEPENDENT_AMBULATORY_CARE_PROVIDER_SITE_OTHER): Payer: Medicare Other | Admitting: Cardiovascular Disease

## 2012-10-02 ENCOUNTER — Encounter: Payer: Self-pay | Admitting: Cardiovascular Disease

## 2012-10-02 VITALS — BP 120/78 | HR 51 | Ht 66.0 in | Wt 177.0 lb

## 2012-10-02 DIAGNOSIS — I2510 Atherosclerotic heart disease of native coronary artery without angina pectoris: Secondary | ICD-10-CM

## 2012-10-02 NOTE — Assessment & Plan Note (Signed)
Michael Donaldson has a hx of coronary artery disease. He is status post coronary artery bypass grafting in 2001. He had a Myoview study in 2011 and repeat Myoview study in early 2014  was identical. Has a severe inferior wall defect with partial reversibility.  His symptoms do not sound like ischemia. He's having more fatigue and this may be related to sleep apnea. I've encouraged him to continue to look for other causes of his fatigue. He will cause back if he develops any episodes of chest pain.  He would like to have shoulder surgery if we think it is safe.   I have Asked him to walk on a regular basis. He is able to walk several miles a day and I think that we can safely clear him for his shoulder surgery.   I'll see him again in 6 months.

## 2012-10-02 NOTE — Patient Instructions (Addendum)
Your physician wants you to follow-up in: 6 months  You will receive a reminder letter in the mail two months in advance. If you don't receive a letter, please call our office to schedule the follow-up appointment.  Your physician recommends that you continue on your current medications as directed. Please refer to the Current Medication list given to you today.   Dr Elease Hashimoto would like you to try to exercise 30 minutes 5 days a week.

## 2012-10-02 NOTE — Progress Notes (Signed)
Michael Donaldson Date of Birth  1934/11/17 [facility]     [place] [facility]  [address]    [address]   [address] [place], [place]  [address]    [place], [place]  [address] [phone]  Fax  [phone]  [phone]  Fax [phone]  Problems: 1. Coronary artery disease-status post CABG ( 2009) 2. Dyslipidemia 3. Hypertension 4. Arthritis 5. Sleep Apnea   History of Present Illness:  Michael Donaldson has done well over the past year.  He is not getting much exercise.  He does not sleep well at night.  He did not like how Ambien made him feel.  He denies any chest pain or dyspnea.  March 31, 2012:  He has been having lots of fatigue.  He was seen by Lawson Fiscal and was scheduled for a stress myoview.   The  myoview images look very similar to his previous study in 2011 ( which was done 2 years after his CABG)   He denies any chest pain with exercise.  He does have some random episodes of CP - not associated with deep breath, changes of position, exertion.  He has lots of shoulder arthritis.    Sept. 5, 2014:  Michael Donaldson has been about the same.  He has severe arthritis in his shoulders and has been recommended that he have shoulder surgery.  He has not been exercising at all.  No angina.   He had a stress myvoiew in Feb. 2014 that showed some inferior defects - unchanged from his previous myoview in 2011.     Current Outpatient Prescriptions on File Prior to Visit  Medication Sig Dispense Refill  . aspirin 81 MG tablet Take 81 mg by mouth daily. Taking 4 daily      . brimonidine (ALPHAGAN) 0.15 % ophthalmic solution Place 1 drop into both eyes 2 (two) times daily.       . Calcium Carbonate-Vitamin D (CALCIUM + D PO) Take 2 tablets by mouth daily.        . Flaxseed, Linseed, (FLAX SEEDS PO) Take by mouth daily.      Marland Kitchen latanoprost (XALATAN) 0.005 % ophthalmic solution Place 1 drop into the right eye at bedtime.      Marland Kitchen lisinopril (PRINIVIL,ZESTRIL) 5 MG tablet Take 5 mg by mouth  daily.        . Multiple Vitamin (MULTIVITAMIN) capsule Take 1 capsule by mouth daily.      . nitroGLYCERIN (NITROSTAT) 0.4 MG SL tablet Place 1 tablet (0.4 mg total) under the tongue every 5 (five) minutes as needed.  25 tablet  11  . Omega-3 Fatty Acids (OMEGA 3 PO) Take by mouth daily.      Marland Kitchen omeprazole (PRILOSEC) 20 MG capsule Take 20 mg by mouth daily.      . Pomegranate, Punica granatum, (POMEGRANATE PO) Take by mouth daily.      . rosuvastatin (CRESTOR) 10 MG tablet Take 10 mg by mouth once a week.      . Sildenafil Citrate (VIAGRA PO) Take by mouth. As directed       . zolpidem (AMBIEN) 10 MG tablet Take 10 mg by mouth at bedtime as needed.      . metoprolol tartrate (LOPRESSOR) 25 MG tablet Take 12.5 mg by mouth 2 (two) times daily.       No current facility-administered medications on file prior to visit.  crestor 10 mg a week   Allergies  Allergen Reactions  . Lipitor [  Atorvastatin Calcium]     Muscle Aches  . Lovastatin     Muscle Aches    Past Medical History  Diagnosis Date  . Coronary artery disease     cabg x 4 in 2009  . Dyslipidemia   . Hypertension   . Arthritis   . Anxiety   . Obstructive sleep apnea     Past Surgical History  Procedure Laterality Date  . Cardiac catheterization  09/30/2007    cabg x 4  . Appendectomy    . Other surgical history      tonsilectomy  . Coronary artery bypass graft  10/02/2007    x 4    History  Smoking status  . Former Smoker  . Types: Cigarettes  Smokeless tobacco  . Not on file    History  Alcohol Use No    Family History  Problem Relation Age of Onset  . Cancer Mother     colon  . Heart attack Father   . Heart attack Brother     Reviw of Systems:  Reviewed in the HPI.  All other systems are negative.  Physical Exam: Blood pressure 120/78, pulse 51, height 5\' 6"  (1.676 m), weight 177 lb (80.287 kg). General: Well developed, well nourished, in no acute distress.  Head: Normocephalic, atraumatic,  sclera non-icteric, mucus membranes are moist,   Neck: Supple. Negative for carotid bruits. JVD not elevated.  Lungs: Clear bilaterally to auscultation without wheezes, rales, or rhonchi. Breathing is unlabored.  Heart: RRR with S1 S2. No murmurs, rubs, or gallops appreciated.  Abdomen: Soft, non-tender, non-distended with normoactive bowel sounds. No hepatomegaly. No rebound/guarding. No obvious abdominal masses.  Msk:  Strength and tone appear normal for age.  Extremities: No clubbing or cyanosis. No edema.  Distal pedal pulses are 2+ and equal bilaterally.  Neuro: Alert and oriented X 3. Moves all extremities spontaneously.  Psych:  Responds to questions appropriately with a normal affect.  ECG: Sept. 5, 2014:  Sinus brady at 51.  Otherwise normal.    Assessment / Plan:

## 2012-12-09 ENCOUNTER — Other Ambulatory Visit: Payer: Self-pay

## 2012-12-09 MED ORDER — METOPROLOL TARTRATE 25 MG PO TABS: 12.5000 mg | ORAL_TABLET | Freq: Two times a day (BID) | ORAL | Status: DC

## 2013-04-23 ENCOUNTER — Ambulatory Visit: Payer: Medicare Other | Admitting: Cardiovascular Disease

## 2013-05-26 ENCOUNTER — Ambulatory Visit: Payer: Medicare HMO | Admitting: Cardiovascular Disease

## 2013-06-25 ENCOUNTER — Other Ambulatory Visit: Payer: Self-pay | Admitting: Internal Medicine

## 2013-06-25 DIAGNOSIS — R042 Hemoptysis: Secondary | ICD-10-CM

## 2013-07-23 ENCOUNTER — Other Ambulatory Visit: Payer: Self-pay

## 2013-07-23 MED ORDER — METOPROLOL TARTRATE 25 MG PO TABS: 12.5000 mg | ORAL_TABLET | Freq: Two times a day (BID) | ORAL | Status: DC

## 2013-07-27 ENCOUNTER — Encounter: Payer: Self-pay | Admitting: Cardiovascular Disease

## 2013-07-27 ENCOUNTER — Ambulatory Visit (INDEPENDENT_AMBULATORY_CARE_PROVIDER_SITE_OTHER): Payer: Medicare HMO | Admitting: Cardiovascular Disease

## 2013-07-27 VITALS — BP 110/70 | HR 60 | Ht 66.0 in | Wt 176.0 lb

## 2013-07-27 DIAGNOSIS — I2584 Coronary atherosclerosis due to calcified coronary lesion: Secondary | ICD-10-CM

## 2013-07-27 DIAGNOSIS — I2510 Atherosclerotic heart disease of native coronary artery without angina pectoris: Secondary | ICD-10-CM

## 2013-07-27 NOTE — Progress Notes (Signed)
Michael Donaldson Date of Birth  11/01/1934 [facility]     [place] Office  [address]    [address]   [address] [place], [place]  [address]    [place], [place]  [address] [phone]  Fax  [phone]  [phone]  Fax [phone]  Problems: 1. Coronary artery disease-status post CABG ( 2009) 2. Dyslipidemia 3. Hypertension 4. Arthritis 5. Sleep Apnea   History of Present Illness:  Michael Donaldson has done well over the past year.  He is not getting much exercise.  He does not sleep well at night.  He did not like how Ambien made him feel.  He denies any chest pain or dyspnea.  March 31, 2012:  He has been having lots of fatigue.  He was seen by Michael FiscalLori and was scheduled for a stress myoview.   The  myoview images look very similar to his previous study in 2011 ( which was done 2 years after his CABG)   He denies any chest pain with exercise.  He does have some random episodes of CP - not associated with deep breath, changes of position, exertion.  He has lots of shoulder arthritis.    Sept. 5, 2014:  Michael Donaldson has been about the same.  He has severe arthritis in his shoulders and has been recommended that he have shoulder surgery.  He has not been exercising at all.  No angina.   He had a stress myvoiew in Feb. 2014 that showed some inferior defects - unchanged from his previous myoview in 2011.    July 27, 2013:  Michael Donaldson is doing OK.  He is walking regularly.  He has not had his shoulder surgery.   He was not sure if he could handle it.  In our last visit, I had told him that if he could walk on a regular basis ( 4 METS) then I think he could safely have his surgery.  He had pneumonia this past winter.      Current Outpatient Prescriptions on File Prior to Visit  Medication Sig Dispense Refill  . aspirin 81 MG tablet Take 81 mg by mouth daily. Taking 4 daily      . brimonidine (ALPHAGAN) 0.15 % ophthalmic solution Place 1 drop into both eyes 2 (two) times daily.        . Calcium Carbonate-Vitamin D (CALCIUM + D PO) Take 2 tablets by mouth daily.        Marland Kitchen. docusate sodium (COLACE) 100 MG capsule Take 100 mg by mouth daily.      . Flaxseed, Linseed, (FLAX SEEDS PO) Take by mouth daily.      . hydrOXYzine (VISTARIL) 50 MG capsule Take 50 mg by mouth 3 (three) times daily as needed for itching.      . latanoprost (XALATAN) 0.005 % ophthalmic solution Place 1 drop into the right eye at bedtime.      . metoprolol tartrate (LOPRESSOR) 25 MG tablet Take 0.5 tablets (12.5 mg total) by mouth 2 (two) times daily.  30 tablet  0  . Multiple Vitamin (MULTIVITAMIN) capsule Take 1 capsule by mouth daily.      . nitroGLYCERIN (NITROSTAT) 0.4 MG SL tablet Place 1 tablet (0.4 mg total) under the tongue every 5 (five) minutes as needed.  25 tablet  11  . Omega-3 Fatty Acids (OMEGA 3 PO) Take by mouth daily.      Marland Kitchen. omeprazole (PRILOSEC) 20 MG capsule Take 20 mg by mouth daily.      .Marland Kitchen  oxyCODONE-acetaminophen (PERCOCET) 10-325 MG per tablet       . Pomegranate, Punica granatum, (POMEGRANATE PO) Take by mouth daily.      . rosuvastatin (CRESTOR) 10 MG tablet Take 10 mg by mouth once a week.      . Sildenafil Citrate (VIAGRA PO) Take by mouth. As directed       . traZODone (DESYREL) 50 MG tablet       . zolpidem (AMBIEN) 10 MG tablet Take 10 mg by mouth at bedtime as needed.       No current facility-administered medications on file prior to visit.  crestor 10 mg a week   Allergies  Allergen Reactions  . Lipitor [Atorvastatin Calcium]     Muscle Aches  . Lovastatin     Muscle Aches    Past Medical History  Diagnosis Date  . Coronary artery disease     cabg x 4 in 2009  . Dyslipidemia   . Hypertension   . Arthritis   . Anxiety   . Obstructive sleep apnea     Past Surgical History  Procedure Laterality Date  . Cardiac catheterization  09/30/2007    cabg x 4  . Appendectomy    . Other surgical history      tonsilectomy  . Coronary artery bypass graft  10/02/2007     x 4    History  Smoking status  . Former Smoker  . Types: Cigarettes  Smokeless tobacco  . Not on file    History  Alcohol Use No    Family History  Problem Relation Age of Onset  . Cancer Mother     colon  . Heart attack Father   . Heart attack Brother     Reviw of Systems:  Reviewed in the HPI.  All other systems are negative.  Physical Exam: Blood pressure 110/70, pulse 60, height 5\' 6"  (1.676 m), weight 176 lb (79.833 kg). General: Well developed, well nourished, in no acute distress.  Head: Normocephalic, atraumatic, sclera non-icteric, mucus membranes are moist,   Neck: Supple. Negative for carotid bruits. JVD not elevated.  Lungs: Clear bilaterally to auscultation without wheezes, rales, or rhonchi. Breathing is unlabored.  Heart: RRR with S1 S2. No murmurs, rubs, or gallops appreciated.  Abdomen: Soft, non-tender, non-distended with normoactive bowel sounds. No hepatomegaly. No rebound/guarding. No obvious abdominal masses.  Msk:  Strength and tone appear normal for age.  Extremities: No clubbing or cyanosis. No edema.  Distal pedal pulses are 2+ and equal bilaterally.  Neuro: Alert and oriented X 3. Moves all extremities spontaneously.  Psych:  Responds to questions appropriately with a normal affect.  ECG: Sept. 5, 2014:  Sinus brady at 53.  Otherwise normal.    Assessment / Plan:

## 2013-07-27 NOTE — Assessment & Plan Note (Signed)
He is stable.  No significant angina. He is able to go up 1-2 flights of stairs without problems. I think he would be at low risk for his shoulder surgery.  He wants to think about it some .  His wife is a bit hesitant / worried that he may have complications. I will see him in 6 months for follow up office visit.  We will discuss surgical clearance for shoulder surgery then.

## 2013-07-27 NOTE — Patient Instructions (Signed)
Your physician recommends that you continue on your current medications as directed. Please refer to the Current Medication list given to you today.  Your physician wants you to follow-up in: 6 months with Dr. Nahser.  You will receive a reminder letter in the mail two months in advance. If you don't receive a letter, please call our office to schedule the follow-up appointment.  

## 2013-10-15 ENCOUNTER — Other Ambulatory Visit: Payer: Self-pay | Admitting: *Deleted

## 2013-10-15 MED ORDER — METOPROLOL TARTRATE 25 MG PO TABS: 12.5000 mg | ORAL_TABLET | Freq: Two times a day (BID) | ORAL | Status: DC

## 2013-12-17 ENCOUNTER — Other Ambulatory Visit: Payer: Self-pay | Admitting: *Deleted

## 2013-12-17 MED ORDER — METOPROLOL TARTRATE 25 MG PO TABS: 12.5000 mg | ORAL_TABLET | Freq: Two times a day (BID) | ORAL | Status: DC

## 2014-01-11 ENCOUNTER — Encounter: Payer: Self-pay | Admitting: Cardiovascular Disease

## 2014-02-03 ENCOUNTER — Ambulatory Visit (INDEPENDENT_AMBULATORY_CARE_PROVIDER_SITE_OTHER): Payer: Medicare Other | Admitting: Cardiovascular Disease

## 2014-02-03 ENCOUNTER — Encounter: Payer: Self-pay | Admitting: Cardiovascular Disease

## 2014-02-03 VITALS — BP 108/80 | HR 50 | Ht 66.0 in | Wt 173.4 lb

## 2014-02-03 DIAGNOSIS — I2510 Atherosclerotic heart disease of native coronary artery without angina pectoris: Secondary | ICD-10-CM

## 2014-02-03 DIAGNOSIS — I25810 Atherosclerosis of coronary artery bypass graft(s) without angina pectoris: Secondary | ICD-10-CM

## 2014-02-03 DIAGNOSIS — R0789 Other chest pain: Secondary | ICD-10-CM

## 2014-02-03 DIAGNOSIS — E785 Hyperlipidemia, unspecified: Secondary | ICD-10-CM

## 2014-02-03 NOTE — Assessment & Plan Note (Signed)
Gardiner [name]  presents with episodes of chest wall pain. Her symptoms do not sound like angina at all. I've reassured her that I do not think she needs any additional testing. I've encouraged her to take Aleve or Motrin around basis for at least 5 days. She'll call me if her symptoms worsen

## 2014-02-03 NOTE — Patient Instructions (Signed)
Your physician recommends that you continue on your current medications as directed. Please refer to the Current Medication list given to you today.  Your physician wants you to follow-up in: 1 year with Dr. Nahser.  You will receive a reminder letter in the mail two months in advance. If you don't receive a letter, please call our office to schedule the follow-up appointment.  

## 2014-02-03 NOTE — Assessment & Plan Note (Signed)
He has his labs checked and his statin ordered  by his primary medical doctor's office.

## 2014-02-03 NOTE — Assessment & Plan Note (Signed)
He is doing well, no angina  

## 2014-02-03 NOTE — Progress Notes (Signed)
Erling ContePhillip J Regis Date of Birth  04/30/1934 [facility]     [place] Office  [address]    [address]   [address] [place], [place]  [address]    [place], [place]  [address] [phone]  Fax  [phone]  [phone]  Fax [phone]  Problems: 1. Coronary artery disease-status post CABG ( 2009) 2. Dyslipidemia 3. Hypertension 4. Arthritis 5. Sleep Apnea   History of Present Illness:  Michael Donaldson has done well over the past year.  He is not getting much exercise.  He does not sleep well at night.  He did not like how Ambien made him feel.  He denies any chest pain or dyspnea.  March 31, 2012:  He has been having lots of fatigue.  He was seen by Lawson FiscalLori and was scheduled for a stress myoview.   The  myoview images look very similar to his previous study in 2011 ( which was done 2 years after his CABG)   He denies any chest pain with exercise.  He does have some random episodes of CP - not associated with deep breath, changes of position, exertion.  He has lots of shoulder arthritis.    Sept. 5, 2014:  Michael Donaldson has been about the same.  He has severe arthritis in his shoulders and has been recommended that he have shoulder surgery.  He has not been exercising at all.  No angina.   He had a stress myvoiew in Feb. 2014 that showed some inferior defects - unchanged from his previous myoview in 2011.    July 27, 2013:  Michael Donaldson is doing OK.  He is walking regularly.  He has not had his shoulder surgery.   He was not sure if he could handle it.  In our last visit, I had told him that if he could walk on a regular basis ( 4 METS) then I think he could safely have his surgery.  He had pneumonia this past winter.     Jan. 7, 2016:  Pt still has not had his shoulder surgery.  No real angina. No complaints today.     Current Outpatient Prescriptions on File Prior to Visit  Medication Sig Dispense Refill  . aspirin 81 MG tablet Take 81 mg by mouth daily. Taking 4 daily     . brimonidine (ALPHAGAN) 0.15 % ophthalmic solution Place 1 drop into both eyes 2 (two) times daily.     . Calcium Carbonate-Vitamin D (CALCIUM + D PO) Take 2 tablets by mouth daily.      Marland Kitchen. docusate sodium (COLACE) 100 MG capsule Take 100 mg by mouth daily.    . Flaxseed, Linseed, (FLAX SEEDS PO) Take by mouth daily.    . hydrOXYzine (VISTARIL) 50 MG capsule Take 50 mg by mouth 3 (three) times daily as needed for itching.    . latanoprost (XALATAN) 0.005 % ophthalmic solution Place 1 drop into the right eye at bedtime.    . metoprolol tartrate (LOPRESSOR) 25 MG tablet Take 0.5 tablets (12.5 mg total) by mouth 2 (two) times daily. 30 tablet 1  . Multiple Vitamin (MULTIVITAMIN) capsule Take 1 capsule by mouth daily.    . nitroGLYCERIN (NITROSTAT) 0.4 MG SL tablet Place 1 tablet (0.4 mg total) under the tongue every 5 (five) minutes as needed. 25 tablet 11  . Omega-3 Fatty Acids (OMEGA 3 PO) Take by mouth daily.    Marland Kitchen. omeprazole (PRILOSEC) 20 MG capsule Take 20 mg by mouth daily.    .Marland Kitchen  oxyCODONE-acetaminophen (PERCOCET) 10-325 MG per tablet Take 1 tablet by mouth every 4 (four) hours as needed.     . Pomegranate, Punica granatum, (POMEGRANATE PO) Take by mouth daily.    . rosuvastatin (CRESTOR) 10 MG tablet Take 10 mg by mouth once a week.    . Sildenafil Citrate (VIAGRA PO) Take by mouth. As directed     . zolpidem (AMBIEN) 10 MG tablet Take 10 mg by mouth at bedtime as needed.     No current facility-administered medications on file prior to visit.  crestor 10 mg a week   Allergies  Allergen Reactions  . Lipitor [Atorvastatin Calcium]     Muscle Aches  . Lovastatin     Muscle Aches    Past Medical History  Diagnosis Date  . Coronary artery disease     cabg x 4 in 2009  . Dyslipidemia   . Hypertension   . Arthritis   . Anxiety   . Obstructive sleep apnea     Past Surgical History  Procedure Laterality Date  . Cardiac catheterization  09/30/2007    cabg x 4  . Appendectomy     . Other surgical history      tonsilectomy  . Coronary artery bypass graft  10/02/2007    x 4    History  Smoking status  . Former Smoker  . Types: Cigarettes  Smokeless tobacco  . Not on file    History  Alcohol Use No    Family History  Problem Relation Age of Onset  . Cancer Mother     colon  . Heart attack Father   . Heart attack Brother     Reviw of Systems:  Reviewed in the HPI.  All other systems are negative.  Physical Exam: Blood pressure 108/80, pulse 50, height  (1.676 m), weight 173 lb 6.4 oz (78.654 kg). General: Well developed, well nourished, in no acute distress.  Head: Normocephalic, atraumatic, sclera non-icteric, mucus membranes are moist,   Neck: Supple. Negative for carotid bruits. JVD not elevated.  Lungs: Clear bilaterally to auscultation without wheezes, rales, or rhonchi. Breathing is unlabored.  Heart: RRR with S1 S2. No murmurs, rubs, or gallops appreciated.  Abdomen: Soft, non-tender, non-distended with normoactive bowel sounds. No hepatomegaly. No rebound/guarding. No obvious abdominal masses.  Msk:  Strength and tone appear normal for age.  Extremities: No clubbing or cyanosis. No edema.  Distal pedal pulses are 2+ and equal bilaterally.  Neuro: Alert and oriented X 3. Moves all extremities spontaneously.  Psych:  Responds to questions appropriately with a normal affect.  ECG: Sept. 5, 2014:  Sinus brady at 53.  Otherwise normal.    Assessment / Plan:

## 2014-02-03 NOTE — Assessment & Plan Note (Signed)
He's not having any angina. Continue current meds

## 2014-03-21 DIAGNOSIS — E291 Testicular hypofunction: Secondary | ICD-10-CM | POA: Diagnosis not present

## 2014-03-30 ENCOUNTER — Telehealth: Payer: Self-pay

## 2014-03-30 MED ORDER — METOPROLOL TARTRATE 25 MG PO TABS: 12.5000 mg | ORAL_TABLET | Freq: Two times a day (BID) | ORAL | Status: DC

## 2014-03-30 NOTE — Telephone Encounter (Signed)
Received refill request for Metoprolol; Rx refilled for 1 year supply

## 2014-05-12 DIAGNOSIS — E291 Testicular hypofunction: Secondary | ICD-10-CM | POA: Diagnosis not present

## 2014-05-26 DIAGNOSIS — M19012 Primary osteoarthritis, left shoulder: Secondary | ICD-10-CM | POA: Diagnosis not present

## 2014-05-26 DIAGNOSIS — M19011 Primary osteoarthritis, right shoulder: Secondary | ICD-10-CM | POA: Diagnosis not present

## 2014-06-14 DIAGNOSIS — J209 Acute bronchitis, unspecified: Secondary | ICD-10-CM | POA: Diagnosis not present

## 2014-06-14 DIAGNOSIS — J069 Acute upper respiratory infection, unspecified: Secondary | ICD-10-CM | POA: Diagnosis not present

## 2014-06-20 DIAGNOSIS — R1013 Epigastric pain: Secondary | ICD-10-CM | POA: Diagnosis not present

## 2014-06-24 DIAGNOSIS — H4011X3 Primary open-angle glaucoma, severe stage: Secondary | ICD-10-CM | POA: Diagnosis not present

## 2014-06-24 DIAGNOSIS — H4011X2 Primary open-angle glaucoma, moderate stage: Secondary | ICD-10-CM | POA: Diagnosis not present

## 2014-07-05 DIAGNOSIS — D649 Anemia, unspecified: Secondary | ICD-10-CM | POA: Diagnosis not present

## 2014-07-05 DIAGNOSIS — K920 Hematemesis: Secondary | ICD-10-CM | POA: Diagnosis not present

## 2014-07-05 DIAGNOSIS — D509 Iron deficiency anemia, unspecified: Secondary | ICD-10-CM | POA: Diagnosis not present

## 2014-07-05 DIAGNOSIS — R1013 Epigastric pain: Secondary | ICD-10-CM | POA: Diagnosis not present

## 2014-07-12 DIAGNOSIS — R739 Hyperglycemia, unspecified: Secondary | ICD-10-CM | POA: Diagnosis not present

## 2014-07-12 DIAGNOSIS — E291 Testicular hypofunction: Secondary | ICD-10-CM | POA: Diagnosis not present

## 2014-07-12 DIAGNOSIS — E780 Pure hypercholesterolemia: Secondary | ICD-10-CM | POA: Diagnosis not present

## 2014-07-12 DIAGNOSIS — E299 Testicular dysfunction, unspecified: Secondary | ICD-10-CM | POA: Diagnosis not present

## 2014-07-12 DIAGNOSIS — I10 Essential (primary) hypertension: Secondary | ICD-10-CM | POA: Diagnosis not present

## 2014-07-22 DIAGNOSIS — N138 Other obstructive and reflux uropathy: Secondary | ICD-10-CM | POA: Diagnosis not present

## 2014-07-22 DIAGNOSIS — R972 Elevated prostate specific antigen [PSA]: Secondary | ICD-10-CM | POA: Diagnosis not present

## 2014-07-22 DIAGNOSIS — N401 Enlarged prostate with lower urinary tract symptoms: Secondary | ICD-10-CM | POA: Diagnosis not present

## 2014-07-22 DIAGNOSIS — E291 Testicular hypofunction: Secondary | ICD-10-CM | POA: Diagnosis not present

## 2014-07-25 DIAGNOSIS — D649 Anemia, unspecified: Secondary | ICD-10-CM | POA: Diagnosis not present

## 2014-07-25 DIAGNOSIS — Z86010 Personal history of colonic polyps: Secondary | ICD-10-CM | POA: Diagnosis not present

## 2014-08-15 DIAGNOSIS — M25519 Pain in unspecified shoulder: Secondary | ICD-10-CM | POA: Diagnosis not present

## 2014-08-15 DIAGNOSIS — M19019 Primary osteoarthritis, unspecified shoulder: Secondary | ICD-10-CM | POA: Diagnosis not present

## 2014-08-15 DIAGNOSIS — G894 Chronic pain syndrome: Secondary | ICD-10-CM | POA: Diagnosis not present

## 2014-08-22 DIAGNOSIS — D649 Anemia, unspecified: Secondary | ICD-10-CM | POA: Diagnosis not present

## 2014-09-08 DIAGNOSIS — E291 Testicular hypofunction: Secondary | ICD-10-CM | POA: Diagnosis not present

## 2014-09-08 DIAGNOSIS — R112 Nausea with vomiting, unspecified: Secondary | ICD-10-CM | POA: Diagnosis not present

## 2014-09-14 DIAGNOSIS — M19019 Primary osteoarthritis, unspecified shoulder: Secondary | ICD-10-CM | POA: Diagnosis not present

## 2014-10-17 DIAGNOSIS — E291 Testicular hypofunction: Secondary | ICD-10-CM | POA: Diagnosis not present

## 2014-10-17 DIAGNOSIS — M25519 Pain in unspecified shoulder: Secondary | ICD-10-CM | POA: Diagnosis not present

## 2014-10-20 DIAGNOSIS — E291 Testicular hypofunction: Secondary | ICD-10-CM | POA: Diagnosis not present

## 2014-10-20 DIAGNOSIS — R739 Hyperglycemia, unspecified: Secondary | ICD-10-CM | POA: Diagnosis not present

## 2014-10-20 DIAGNOSIS — I10 Essential (primary) hypertension: Secondary | ICD-10-CM | POA: Diagnosis not present

## 2014-10-20 DIAGNOSIS — M25511 Pain in right shoulder: Secondary | ICD-10-CM | POA: Diagnosis not present

## 2014-11-09 DIAGNOSIS — M19019 Primary osteoarthritis, unspecified shoulder: Secondary | ICD-10-CM | POA: Diagnosis not present

## 2014-11-22 DIAGNOSIS — E291 Testicular hypofunction: Secondary | ICD-10-CM | POA: Diagnosis not present

## 2014-12-09 DIAGNOSIS — R002 Palpitations: Secondary | ICD-10-CM | POA: Diagnosis not present

## 2014-12-09 DIAGNOSIS — R0789 Other chest pain: Secondary | ICD-10-CM | POA: Diagnosis not present

## 2014-12-09 DIAGNOSIS — R072 Precordial pain: Secondary | ICD-10-CM | POA: Diagnosis not present

## 2014-12-09 DIAGNOSIS — R079 Chest pain, unspecified: Secondary | ICD-10-CM | POA: Diagnosis not present

## 2014-12-09 DIAGNOSIS — K2900 Acute gastritis without bleeding: Secondary | ICD-10-CM | POA: Diagnosis not present

## 2014-12-21 DIAGNOSIS — E291 Testicular hypofunction: Secondary | ICD-10-CM | POA: Diagnosis not present

## 2015-01-09 DIAGNOSIS — K219 Gastro-esophageal reflux disease without esophagitis: Secondary | ICD-10-CM | POA: Diagnosis not present

## 2015-01-09 DIAGNOSIS — D509 Iron deficiency anemia, unspecified: Secondary | ICD-10-CM | POA: Diagnosis not present

## 2015-01-10 DIAGNOSIS — F1920 Other psychoactive substance dependence, uncomplicated: Secondary | ICD-10-CM | POA: Diagnosis not present

## 2015-01-10 DIAGNOSIS — Z5181 Encounter for therapeutic drug level monitoring: Secondary | ICD-10-CM | POA: Diagnosis not present

## 2015-01-17 DIAGNOSIS — R972 Elevated prostate specific antigen [PSA]: Secondary | ICD-10-CM | POA: Diagnosis not present

## 2015-01-19 DIAGNOSIS — E291 Testicular hypofunction: Secondary | ICD-10-CM | POA: Diagnosis not present

## 2015-02-20 DIAGNOSIS — R972 Elevated prostate specific antigen [PSA]: Secondary | ICD-10-CM | POA: Diagnosis not present

## 2015-02-20 DIAGNOSIS — N401 Enlarged prostate with lower urinary tract symptoms: Secondary | ICD-10-CM | POA: Diagnosis not present

## 2015-02-20 DIAGNOSIS — Z0000 Encounter for general adult medical examination without abnormal findings: Secondary | ICD-10-CM | POA: Diagnosis not present

## 2015-02-20 DIAGNOSIS — N138 Other obstructive and reflux uropathy: Secondary | ICD-10-CM | POA: Diagnosis not present

## 2015-02-20 DIAGNOSIS — R739 Hyperglycemia, unspecified: Secondary | ICD-10-CM | POA: Diagnosis not present

## 2015-02-20 DIAGNOSIS — R3129 Other microscopic hematuria: Secondary | ICD-10-CM | POA: Diagnosis not present

## 2015-02-20 DIAGNOSIS — I10 Essential (primary) hypertension: Secondary | ICD-10-CM | POA: Diagnosis not present

## 2015-02-20 DIAGNOSIS — E291 Testicular hypofunction: Secondary | ICD-10-CM | POA: Diagnosis not present

## 2015-02-23 DIAGNOSIS — E7800 Pure hypercholesterolemia, unspecified: Secondary | ICD-10-CM | POA: Diagnosis not present

## 2015-02-23 DIAGNOSIS — Z5181 Encounter for therapeutic drug level monitoring: Secondary | ICD-10-CM | POA: Diagnosis not present

## 2015-02-23 DIAGNOSIS — I10 Essential (primary) hypertension: Secondary | ICD-10-CM | POA: Diagnosis not present

## 2015-02-23 DIAGNOSIS — Z0001 Encounter for general adult medical examination with abnormal findings: Secondary | ICD-10-CM | POA: Diagnosis not present

## 2015-02-23 DIAGNOSIS — F1120 Opioid dependence, uncomplicated: Secondary | ICD-10-CM | POA: Diagnosis not present

## 2015-02-23 DIAGNOSIS — E291 Testicular hypofunction: Secondary | ICD-10-CM | POA: Diagnosis not present

## 2015-03-29 DIAGNOSIS — M25519 Pain in unspecified shoulder: Secondary | ICD-10-CM | POA: Diagnosis not present

## 2015-03-29 DIAGNOSIS — M19019 Primary osteoarthritis, unspecified shoulder: Secondary | ICD-10-CM | POA: Diagnosis not present

## 2015-04-04 DIAGNOSIS — E291 Testicular hypofunction: Secondary | ICD-10-CM | POA: Diagnosis not present

## 2015-04-04 DIAGNOSIS — N5201 Erectile dysfunction due to arterial insufficiency: Secondary | ICD-10-CM | POA: Diagnosis not present

## 2015-04-08 ENCOUNTER — Other Ambulatory Visit: Payer: Self-pay | Admitting: Cardiovascular Disease

## 2015-04-11 DIAGNOSIS — E291 Testicular hypofunction: Secondary | ICD-10-CM | POA: Diagnosis not present

## 2015-05-10 DIAGNOSIS — E291 Testicular hypofunction: Secondary | ICD-10-CM | POA: Diagnosis not present

## 2015-06-06 ENCOUNTER — Other Ambulatory Visit: Payer: Self-pay | Admitting: Cardiovascular Disease

## 2015-06-13 DIAGNOSIS — I10 Essential (primary) hypertension: Secondary | ICD-10-CM | POA: Diagnosis not present

## 2015-06-13 DIAGNOSIS — G4733 Obstructive sleep apnea (adult) (pediatric): Secondary | ICD-10-CM | POA: Diagnosis not present

## 2015-06-13 DIAGNOSIS — E7800 Pure hypercholesterolemia, unspecified: Secondary | ICD-10-CM | POA: Diagnosis not present

## 2015-07-10 DIAGNOSIS — E291 Testicular hypofunction: Secondary | ICD-10-CM | POA: Diagnosis not present

## 2015-07-11 ENCOUNTER — Other Ambulatory Visit: Payer: Self-pay | Admitting: Cardiovascular Disease

## 2015-07-12 ENCOUNTER — Telehealth: Payer: Self-pay | Admitting: Cardiovascular Disease

## 2015-07-12 MED ORDER — METOPROLOL TARTRATE 25 MG PO TABS: 12.5000 mg | ORAL_TABLET | Freq: Two times a day (BID) | ORAL | Status: DC

## 2015-07-12 NOTE — Telephone Encounter (Signed)
New message   *STAT* If patient is at the pharmacy, call can be transferred to refill team.   1. Which medications need to be refilled? (please list name of each medication and dose if known) metoprolol tartrate (LOPRESSOR) 25 MG tablet  2. Which pharmacy/location (including street and city if local pharmacy) is medication to be sent to? [facility]   3. Do they need a 30 day or 90 day supply? 30  Comments: Per pt he is completely out

## 2015-07-12 NOTE — Telephone Encounter (Signed)
Michael Donaldson  06/06/2015  Refill  MRN:  [id number]   Description: 81 year old male  Provider: Vesta MixerPhilip J Nahser, MD  [facility]       Call Documentation     No notes of this type exist for this encounter.     Encounter MyChart Messages     No messages in this encounter     Approved      Disp Refills Start End    metoprolol tartrate (LOPRESSOR) 25 MG tablet 14 tablet 0 06/07/2015     Sig - Route:  Take 0.5 tablets (12.5 mg total) by mouth 2 (two) times daily. Patient is overdue for an appointment. Please call and schedule for further refills - Oral    Class:  Print    DAW:  No    Authorizing Provider:  Vesta MixerPhilip J Nahser, MD    Ordering User:  Valrie HartMindy L Isley, CMA      Visit Pharmacy     [facility] [place] - [address]   IT PRINTED, WILL RESEND

## 2015-08-05 ENCOUNTER — Other Ambulatory Visit: Payer: Self-pay | Admitting: Cardiovascular Disease

## 2015-08-08 DIAGNOSIS — E291 Testicular hypofunction: Secondary | ICD-10-CM | POA: Diagnosis not present

## 2015-08-17 ENCOUNTER — Encounter: Payer: Self-pay | Admitting: Cardiovascular Disease

## 2015-08-17 ENCOUNTER — Ambulatory Visit (INDEPENDENT_AMBULATORY_CARE_PROVIDER_SITE_OTHER): Payer: Medicare Other | Admitting: Cardiovascular Disease

## 2015-08-17 ENCOUNTER — Encounter (INDEPENDENT_AMBULATORY_CARE_PROVIDER_SITE_OTHER): Payer: Self-pay

## 2015-08-17 VITALS — BP 106/60 | HR 62 | Ht 66.0 in | Wt 175.8 lb

## 2015-08-17 DIAGNOSIS — E785 Hyperlipidemia, unspecified: Secondary | ICD-10-CM | POA: Diagnosis not present

## 2015-08-17 DIAGNOSIS — I2510 Atherosclerotic heart disease of native coronary artery without angina pectoris: Secondary | ICD-10-CM | POA: Diagnosis not present

## 2015-08-17 MED ORDER — NITROGLYCERIN 0.4 MG SL SUBL: 0.4000 mg | SUBLINGUAL_TABLET | SUBLINGUAL | Status: DC | PRN

## 2015-08-17 MED ORDER — METOPROLOL TARTRATE 25 MG PO TABS: 12.5000 mg | ORAL_TABLET | Freq: Two times a day (BID) | ORAL | Status: DC

## 2015-08-17 NOTE — Progress Notes (Signed)
Michael Donaldson Date of Birth  07/09/1934 [facility]     [place] Office  [address]    [address]   [address] [place], [place]  [address]    [place], [place]  [address] [phone]  Fax  [phone]  [phone]  Fax [phone]  Problems: 1. Coronary artery disease-status post CABG ( 2009) 2. Dyslipidemia 3. Hypertension 4. Arthritis 5. Sleep Apnea   History of Present Illness:  Michael Donaldson has done well over the past year.  He is not getting much exercise.  He does not sleep well at night.  He did not like how Ambien made him feel.  He denies any chest pain or dyspnea.  March 31, 2012:  He has been having lots of fatigue.  He was seen by Lawson FiscalLori and was scheduled for a stress myoview.   The  myoview images look very similar to his previous study in 2011 ( which was done 2 years after his CABG)   He denies any chest pain with exercise.  He does have some random episodes of CP - not associated with deep breath, changes of position, exertion.  He has lots of shoulder arthritis.    Sept. 5, 2014:  Michael Donaldson has been about the same.  He has severe arthritis in his shoulders and has been recommended that he have shoulder surgery.  He has not been exercising at all.  No angina.   He had a stress myvoiew in Feb. 2014 that showed some inferior defects - unchanged from his previous myoview in 2011.    July 27, 2013:  Michael Donaldson is doing OK.  He is walking regularly.  He has not had his shoulder surgery.   He was not sure if he could handle it.  In our last visit, I had told him that if he could walk on a regular basis ( 4 METS) then I think he could safely have his surgery.  He had pneumonia this past winter.     Jan. 7, 2016:  Pt still has not had his shoulder surgery.  No real angina. No complaints today.  August 17, 2015:  Seen after 1 1/2 years. 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 He's not had a heart cavitation since that time.   Current Outpatient Prescriptions on File Prior to Visit  Medication Sig Dispense Refill  . aspirin 81 MG tablet Take 81 mg by mouth daily. Taking 4 daily    . brimonidine (ALPHAGAN) 0.15 % ophthalmic solution Place 1 drop into both eyes 2 (two) times daily.     . Calcium Carbonate-Vitamin D (CALCIUM + D PO) Take 2 tablets by mouth daily.      Marland Kitchen. docusate sodium (COLACE) 100 MG capsule Take 100 mg by mouth daily.    . Flaxseed, Linseed, (FLAX SEEDS PO) Take by mouth daily. Pt unsure of dosage    . hydrOXYzine (VISTARIL) 50 MG capsule Take 50 mg by mouth 3 (three) times daily as needed for itching.    . latanoprost (XALATAN) 0.005 % ophthalmic solution Place 1 drop into the right eye at bedtime.    . metoprolol tartrate (LOPRESSOR) 25 MG tablet TAKE 1/2 TABLET BY MOUTH 2 TIMES DAILY. *PATIENT OVERDUE FOR OFFICE VISIT* 30 tablet 0  . Multiple Vitamin (MULTIVITAMIN) capsule Take 1 capsule by mouth daily.    . nitroGLYCERIN (  NITROSTAT) 0.4 MG SL tablet Place 1 tablet (0.4 mg total) under the tongue every 5 (five) minutes as needed. 25 tablet 11  . Omega-3 Fatty Acids (OMEGA 3 PO) Take by mouth daily. Pt unsure of dosage    . omeprazole (PRILOSEC) 20 MG capsule Take 20 mg by mouth daily.    Marland Kitchen oxyCODONE-acetaminophen (PERCOCET) 10-325 MG per tablet Take 1 tablet by mouth every 4 (four) hours as needed.     . Pomegranate, Punica granatum, (POMEGRANATE PO) Take by mouth daily. Pt unsure of dosage    . Sildenafil Citrate (VIAGRA PO) Take by mouth. As directed. Pt unsure of dosage    . simvastatin (ZOCOR) 20 MG tablet Take 20 mg by mouth daily.    Marland Kitchen zolpidem (AMBIEN) 10 MG tablet Take 10 mg by mouth at bedtime as needed.     No current facility-administered medications on file prior to visit.     Allergies  Allergen Reactions  . Lipitor [Atorvastatin Calcium]     Muscle Aches  . Lovastatin     Muscle  Aches    Past Medical History  Diagnosis Date  . Coronary artery disease     cabg x 4 in 2009  . Dyslipidemia   . Hypertension   . Arthritis   . Anxiety   . Obstructive sleep apnea     Past Surgical History  Procedure Laterality Date  . Cardiac catheterization  09/30/2007    cabg x 4  . Appendectomy    . Other surgical history      tonsilectomy  . Coronary artery bypass graft  10/02/2007    x 4    History  Smoking status  . Former Smoker  . Types: Cigarettes  Smokeless tobacco  . Not on file    History  Alcohol Use No    Family History  Problem Relation Age of Onset  . Cancer Mother     colon  . Heart attack Father   . Heart attack Brother     Reviw of Systems:  Reviewed in the HPI.  All other systems are negative.  Physical Exam: Blood pressure 106/60, pulse 62, height  (1.676 m), weight 175 lb 12.8 oz (79.742 kg), SpO2 92 %. General: Well developed, well nourished, in no acute distress.  Head: Normocephalic, atraumatic, sclera non-icteric, mucus membranes are moist,   Neck: Supple. Negative for carotid bruits. JVD not elevated.  Lungs: Clear bilaterally to auscultation without wheezes, rales, or rhonchi. Breathing is unlabored.  Heart: RRR with S1 S2. No murmurs, rubs, or gallops appreciated.  Abdomen: Soft, non-tender, non-distended with normoactive bowel sounds. No hepatomegaly. No rebound/guarding. No obvious abdominal masses.  Msk:  Strength and tone appear normal for age.  Extremities: No clubbing or cyanosis. No edema.  Distal pedal pulses are 2+ and equal bilaterally.  Neuro: Alert and oriented X 3. Moves all extremities spontaneously.  Psych:  Responds to questions appropriately with a normal affect.  ECG: August 17, 2015:  NSR at 60.    Normal   Assessment / Plan:   1. Coronary artery disease-status post CABG ( 2009) He's not had any episodes of angina. Continue current medications. He had an abnormal Myoview study in 2014 but  decided not to have a heart catheterization. He really is not having any significant angina at this point. He'll give [place] a call if he has worsening angina.  2. Dyslipidemia -   managed by his primary medical doctor  3. Hypertension - blood pressure  is well-controlled.    Kristeen Miss, MD  08/17/2015 5:19 PM    [facility] [address] [place],  [address] [place], [place]  [address] Pager [phone] Phone: [phone]; Fax: [phone]

## 2015-08-17 NOTE — Patient Instructions (Addendum)
Medication Instructions:  Your physician recommends that you continue on your current medications as directed. Please refer to the Current Medication list given to you today. Refills of your Metoprolol and Nitroglycerin have been sent to your pharmacy  Labwork: None Ordered   Testing/Procedures: None Ordered   Follow-Up: Your physician wants you to follow-up in: 1 year with Dr. Elease HashimotoNahser.  You will receive a reminder letter in the mail two months in advance. If you don't receive a letter, please call our office to schedule the follow-up appointment.   If you need a refill on your cardiac medications before your next appointment, please call your pharmacy.   Thank you for choosing [facility]! Eligha BridegroomMichelle Nusaybah Ivie, RN [phone]

## 2015-08-23 DIAGNOSIS — E291 Testicular hypofunction: Secondary | ICD-10-CM | POA: Diagnosis not present

## 2015-08-23 DIAGNOSIS — N401 Enlarged prostate with lower urinary tract symptoms: Secondary | ICD-10-CM | POA: Diagnosis not present

## 2015-08-23 DIAGNOSIS — R311 Benign essential microscopic hematuria: Secondary | ICD-10-CM | POA: Diagnosis not present

## 2015-08-24 DIAGNOSIS — E291 Testicular hypofunction: Secondary | ICD-10-CM | POA: Diagnosis not present

## 2015-08-24 DIAGNOSIS — I10 Essential (primary) hypertension: Secondary | ICD-10-CM | POA: Diagnosis not present

## 2015-08-30 DIAGNOSIS — I10 Essential (primary) hypertension: Secondary | ICD-10-CM | POA: Diagnosis not present

## 2015-08-30 DIAGNOSIS — M25511 Pain in right shoulder: Secondary | ICD-10-CM | POA: Diagnosis not present

## 2015-09-11 DIAGNOSIS — I10 Essential (primary) hypertension: Secondary | ICD-10-CM | POA: Diagnosis not present

## 2015-09-13 DIAGNOSIS — M25511 Pain in right shoulder: Secondary | ICD-10-CM | POA: Diagnosis not present

## 2015-10-05 DIAGNOSIS — M25511 Pain in right shoulder: Secondary | ICD-10-CM | POA: Diagnosis not present

## 2015-10-05 DIAGNOSIS — M25512 Pain in left shoulder: Secondary | ICD-10-CM | POA: Diagnosis not present

## 2015-10-23 DIAGNOSIS — H401113 Primary open-angle glaucoma, right eye, severe stage: Secondary | ICD-10-CM | POA: Diagnosis not present

## 2015-10-23 DIAGNOSIS — H401122 Primary open-angle glaucoma, left eye, moderate stage: Secondary | ICD-10-CM | POA: Diagnosis not present

## 2015-10-30 DIAGNOSIS — G8929 Other chronic pain: Secondary | ICD-10-CM | POA: Diagnosis not present

## 2015-10-30 DIAGNOSIS — M25511 Pain in right shoulder: Secondary | ICD-10-CM | POA: Diagnosis not present

## 2015-10-30 DIAGNOSIS — M25512 Pain in left shoulder: Secondary | ICD-10-CM | POA: Diagnosis not present

## 2015-11-22 DIAGNOSIS — I10 Essential (primary) hypertension: Secondary | ICD-10-CM | POA: Diagnosis not present

## 2015-11-22 DIAGNOSIS — E291 Testicular hypofunction: Secondary | ICD-10-CM | POA: Diagnosis not present

## 2015-11-22 DIAGNOSIS — Z125 Encounter for screening for malignant neoplasm of prostate: Secondary | ICD-10-CM | POA: Diagnosis not present

## 2015-11-28 DIAGNOSIS — R5383 Other fatigue: Secondary | ICD-10-CM | POA: Diagnosis not present

## 2015-12-20 DIAGNOSIS — E291 Testicular hypofunction: Secondary | ICD-10-CM | POA: Diagnosis not present

## 2015-12-25 DIAGNOSIS — H401113 Primary open-angle glaucoma, right eye, severe stage: Secondary | ICD-10-CM | POA: Diagnosis not present

## 2015-12-25 DIAGNOSIS — H401122 Primary open-angle glaucoma, left eye, moderate stage: Secondary | ICD-10-CM | POA: Diagnosis not present

## 2016-01-11 DIAGNOSIS — G8929 Other chronic pain: Secondary | ICD-10-CM | POA: Diagnosis not present

## 2016-01-11 DIAGNOSIS — M25511 Pain in right shoulder: Secondary | ICD-10-CM | POA: Diagnosis not present

## 2016-01-18 DIAGNOSIS — E291 Testicular hypofunction: Secondary | ICD-10-CM | POA: Diagnosis not present

## 2016-02-22 DIAGNOSIS — Z5181 Encounter for therapeutic drug level monitoring: Secondary | ICD-10-CM | POA: Diagnosis not present

## 2016-02-22 DIAGNOSIS — M25511 Pain in right shoulder: Secondary | ICD-10-CM | POA: Diagnosis not present

## 2016-04-01 DIAGNOSIS — M25519 Pain in unspecified shoulder: Secondary | ICD-10-CM | POA: Diagnosis not present

## 2016-04-01 DIAGNOSIS — N390 Urinary tract infection, site not specified: Secondary | ICD-10-CM | POA: Diagnosis not present

## 2016-04-01 DIAGNOSIS — Z5181 Encounter for therapeutic drug level monitoring: Secondary | ICD-10-CM | POA: Diagnosis not present

## 2016-04-01 DIAGNOSIS — R319 Hematuria, unspecified: Secondary | ICD-10-CM | POA: Diagnosis not present

## 2016-04-01 DIAGNOSIS — R112 Nausea with vomiting, unspecified: Secondary | ICD-10-CM | POA: Diagnosis not present

## 2016-04-01 DIAGNOSIS — M25511 Pain in right shoulder: Secondary | ICD-10-CM | POA: Diagnosis not present

## 2016-04-01 DIAGNOSIS — J029 Acute pharyngitis, unspecified: Secondary | ICD-10-CM | POA: Diagnosis not present

## 2016-04-01 DIAGNOSIS — Z79899 Other long term (current) drug therapy: Secondary | ICD-10-CM | POA: Diagnosis not present

## 2016-04-03 DIAGNOSIS — R112 Nausea with vomiting, unspecified: Secondary | ICD-10-CM | POA: Diagnosis not present

## 2016-04-03 DIAGNOSIS — R142 Eructation: Secondary | ICD-10-CM | POA: Diagnosis not present

## 2016-04-03 DIAGNOSIS — J029 Acute pharyngitis, unspecified: Secondary | ICD-10-CM | POA: Diagnosis not present

## 2016-04-03 DIAGNOSIS — R1013 Epigastric pain: Secondary | ICD-10-CM | POA: Diagnosis not present

## 2016-04-03 DIAGNOSIS — D509 Iron deficiency anemia, unspecified: Secondary | ICD-10-CM | POA: Diagnosis not present

## 2016-04-03 DIAGNOSIS — K219 Gastro-esophageal reflux disease without esophagitis: Secondary | ICD-10-CM | POA: Diagnosis not present

## 2016-04-04 DIAGNOSIS — J029 Acute pharyngitis, unspecified: Secondary | ICD-10-CM | POA: Diagnosis not present

## 2016-04-04 DIAGNOSIS — D649 Anemia, unspecified: Secondary | ICD-10-CM | POA: Diagnosis not present

## 2016-04-11 DIAGNOSIS — K219 Gastro-esophageal reflux disease without esophagitis: Secondary | ICD-10-CM | POA: Diagnosis not present

## 2016-04-11 DIAGNOSIS — E291 Testicular hypofunction: Secondary | ICD-10-CM | POA: Diagnosis not present

## 2016-04-11 DIAGNOSIS — R1011 Right upper quadrant pain: Secondary | ICD-10-CM | POA: Diagnosis not present

## 2016-04-12 ENCOUNTER — Other Ambulatory Visit: Payer: Self-pay | Admitting: Gastroenterology

## 2016-04-12 DIAGNOSIS — R1011 Right upper quadrant pain: Secondary | ICD-10-CM

## 2016-04-17 DIAGNOSIS — K219 Gastro-esophageal reflux disease without esophagitis: Secondary | ICD-10-CM | POA: Diagnosis not present

## 2016-04-17 DIAGNOSIS — E119 Type 2 diabetes mellitus without complications: Secondary | ICD-10-CM | POA: Diagnosis not present

## 2016-04-17 DIAGNOSIS — R1013 Epigastric pain: Secondary | ICD-10-CM | POA: Diagnosis not present

## 2016-04-18 DIAGNOSIS — M25511 Pain in right shoulder: Secondary | ICD-10-CM | POA: Diagnosis not present

## 2016-04-18 DIAGNOSIS — R109 Unspecified abdominal pain: Secondary | ICD-10-CM | POA: Diagnosis not present

## 2016-04-18 DIAGNOSIS — Z5181 Encounter for therapeutic drug level monitoring: Secondary | ICD-10-CM | POA: Diagnosis not present

## 2016-04-18 DIAGNOSIS — R5383 Other fatigue: Secondary | ICD-10-CM | POA: Diagnosis not present

## 2016-04-18 DIAGNOSIS — D509 Iron deficiency anemia, unspecified: Secondary | ICD-10-CM | POA: Diagnosis not present

## 2016-04-18 DIAGNOSIS — G8929 Other chronic pain: Secondary | ICD-10-CM | POA: Diagnosis not present

## 2016-04-18 DIAGNOSIS — I10 Essential (primary) hypertension: Secondary | ICD-10-CM | POA: Diagnosis not present

## 2016-04-23 ENCOUNTER — Other Ambulatory Visit: Payer: Medicare Other

## 2016-04-24 DIAGNOSIS — D509 Iron deficiency anemia, unspecified: Secondary | ICD-10-CM | POA: Diagnosis not present

## 2016-04-24 DIAGNOSIS — K2930 Chronic superficial gastritis without bleeding: Secondary | ICD-10-CM | POA: Diagnosis not present

## 2016-04-24 DIAGNOSIS — R1013 Epigastric pain: Secondary | ICD-10-CM | POA: Diagnosis not present

## 2016-04-24 DIAGNOSIS — K210 Gastro-esophageal reflux disease with esophagitis: Secondary | ICD-10-CM | POA: Diagnosis not present

## 2016-04-30 DIAGNOSIS — K2930 Chronic superficial gastritis without bleeding: Secondary | ICD-10-CM | POA: Diagnosis not present

## 2016-05-23 ENCOUNTER — Other Ambulatory Visit: Payer: Self-pay

## 2016-05-23 NOTE — Patient Outreach (Signed)
Medication Adherence for Mr. Michael Donaldson  [name] said he has his medication for simvastatin 20 mg and metformin 500 mg he just recently pick then up and does not need anything right now.   Lillia Abed CPhT Pharmacy Technician [facility] Direct Dial [phone]  Fax [phone] [email or web address]

## 2016-06-12 DIAGNOSIS — E291 Testicular hypofunction: Secondary | ICD-10-CM | POA: Diagnosis not present

## 2016-07-03 DIAGNOSIS — I10 Essential (primary) hypertension: Secondary | ICD-10-CM | POA: Diagnosis not present

## 2016-07-03 DIAGNOSIS — E7800 Pure hypercholesterolemia, unspecified: Secondary | ICD-10-CM | POA: Diagnosis not present

## 2016-07-03 DIAGNOSIS — Z0000 Encounter for general adult medical examination without abnormal findings: Secondary | ICD-10-CM | POA: Diagnosis not present

## 2016-07-15 DIAGNOSIS — Z0000 Encounter for general adult medical examination without abnormal findings: Secondary | ICD-10-CM | POA: Diagnosis not present

## 2016-07-15 DIAGNOSIS — Z79899 Other long term (current) drug therapy: Secondary | ICD-10-CM | POA: Diagnosis not present

## 2016-07-15 DIAGNOSIS — I2510 Atherosclerotic heart disease of native coronary artery without angina pectoris: Secondary | ICD-10-CM | POA: Diagnosis not present

## 2016-07-15 DIAGNOSIS — R739 Hyperglycemia, unspecified: Secondary | ICD-10-CM | POA: Diagnosis not present

## 2016-08-01 DIAGNOSIS — E291 Testicular hypofunction: Secondary | ICD-10-CM | POA: Diagnosis not present

## 2016-08-28 DIAGNOSIS — E291 Testicular hypofunction: Secondary | ICD-10-CM | POA: Diagnosis not present

## 2016-09-02 ENCOUNTER — Other Ambulatory Visit: Payer: Self-pay | Admitting: Cardiovascular Disease

## 2016-10-08 ENCOUNTER — Other Ambulatory Visit: Payer: Self-pay | Admitting: Cardiovascular Disease

## 2016-10-15 DIAGNOSIS — R739 Hyperglycemia, unspecified: Secondary | ICD-10-CM | POA: Diagnosis not present

## 2016-10-15 DIAGNOSIS — I2510 Atherosclerotic heart disease of native coronary artery without angina pectoris: Secondary | ICD-10-CM | POA: Diagnosis not present

## 2016-10-15 DIAGNOSIS — Z5181 Encounter for therapeutic drug level monitoring: Secondary | ICD-10-CM | POA: Diagnosis not present

## 2016-10-16 DIAGNOSIS — E291 Testicular hypofunction: Secondary | ICD-10-CM | POA: Diagnosis not present

## 2016-10-16 DIAGNOSIS — I10 Essential (primary) hypertension: Secondary | ICD-10-CM | POA: Diagnosis not present

## 2016-10-16 DIAGNOSIS — Z23 Encounter for immunization: Secondary | ICD-10-CM | POA: Diagnosis not present

## 2016-10-16 DIAGNOSIS — M545 Low back pain: Secondary | ICD-10-CM | POA: Diagnosis not present

## 2016-10-16 DIAGNOSIS — G8929 Other chronic pain: Secondary | ICD-10-CM | POA: Diagnosis not present

## 2016-10-16 DIAGNOSIS — M25511 Pain in right shoulder: Secondary | ICD-10-CM | POA: Diagnosis not present

## 2016-10-28 ENCOUNTER — Other Ambulatory Visit: Payer: Self-pay | Admitting: Cardiovascular Disease

## 2016-11-18 DIAGNOSIS — Z5181 Encounter for therapeutic drug level monitoring: Secondary | ICD-10-CM | POA: Diagnosis not present

## 2016-11-18 DIAGNOSIS — M25511 Pain in right shoulder: Secondary | ICD-10-CM | POA: Diagnosis not present

## 2016-11-18 DIAGNOSIS — M545 Low back pain: Secondary | ICD-10-CM | POA: Diagnosis not present

## 2016-11-18 DIAGNOSIS — E291 Testicular hypofunction: Secondary | ICD-10-CM | POA: Diagnosis not present

## 2016-11-18 DIAGNOSIS — G8929 Other chronic pain: Secondary | ICD-10-CM | POA: Diagnosis not present

## 2016-11-29 ENCOUNTER — Other Ambulatory Visit: Payer: Self-pay | Admitting: Cardiovascular Disease

## 2016-12-06 ENCOUNTER — Ambulatory Visit: Payer: Medicare Other | Admitting: Cardiovascular Disease

## 2016-12-06 ENCOUNTER — Encounter: Payer: Self-pay | Admitting: Cardiovascular Disease

## 2016-12-06 VITALS — BP 122/60 | HR 59 | Ht 66.0 in | Wt 171.0 lb

## 2016-12-06 DIAGNOSIS — I2510 Atherosclerotic heart disease of native coronary artery without angina pectoris: Secondary | ICD-10-CM

## 2016-12-06 DIAGNOSIS — E782 Mixed hyperlipidemia: Secondary | ICD-10-CM

## 2016-12-06 MED ORDER — METOPROLOL TARTRATE 25 MG PO TABS: ORAL_TABLET | ORAL | 3 refills | Status: DC

## 2016-12-06 MED ORDER — NITROGLYCERIN 0.4 MG SL SUBL: 0.4000 mg | SUBLINGUAL_TABLET | SUBLINGUAL | 6 refills | Status: DC | PRN

## 2016-12-06 NOTE — Progress Notes (Signed)
Michael Donaldson Date of Birth  12/12/1934 [facility]     [place] [facility]  [address]    [address]   [address] [place], [place]  [address]    [place], [place]  [id number] [phone]  Fax  [phone]  [phone]  Fax [phone]  Problems: 1. Coronary artery disease-status post CABG ( 2009) 2. Dyslipidemia 3. Hypertension 4. Arthritis 5. Sleep Apnea   History of Present Illness:  Michael Donaldson has done well over the past year.  He is not getting much exercise.  He does not sleep well at night.  He did not like how Ambien made him feel.  He denies any chest pain or dyspnea.  March 31, 2012:  He has been having lots of fatigue.  He was seen by Lawson FiscalLori and was scheduled for a stress myoview.   The  myoview images look very similar to his previous study in 2011 ( which was done 2 years after his CABG)   He denies any chest pain with exercise.  He does have some random episodes of CP - not associated with deep breath, changes of position, exertion.  He has lots of shoulder arthritis.    Sept. 5, 2014:  Michael Donaldson has been about the same.  He has severe arthritis in his shoulders and has been recommended that he have shoulder surgery.  He has not been exercising at all.  No angina.   He had a stress myvoiew in Feb. 2014 that showed some inferior defects - unchanged from his previous myoview in 2011.    July 27, 2013:  Michael Donaldson is doing OK.  He is walking regularly.  He has not had his shoulder surgery.   He was not sure if he could handle it.  In our last visit, I had told him that if he could walk on a regular basis ( 4 METS) then I think he could safely have his surgery.  He had pneumonia this past winter.     Jan. 7, 2016:  Pt still has not had his shoulder surgery.  No real angina. No complaints today.  August 17, 2015:  Seen after 1 1/2 years. 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 He's not had a heart cavitation since that time.   Nov. 9, 2018::  Doing well. No CP or dyspnea.  Exercising some .   Able to do his normal activities without difficulties.    Current Outpatient Medications on File Prior to Visit  Medication Sig Dispense Refill  . aspirin 81 MG tablet Take 81 mg by mouth daily. Taking 4 daily    . brimonidine (ALPHAGAN) 0.15 % ophthalmic solution Place 1 drop into both eyes 2 (two) times daily.     . Calcium Carbonate-Vitamin D (CALCIUM + D PO) Take 2 tablets by mouth daily.      Marland Kitchen. docusate sodium (COLACE) 100 MG capsule Take 100 mg by mouth daily.    . Flaxseed, Linseed, (FLAX SEEDS PO) Take by mouth daily. Pt unsure of dosage    . hydrOXYzine (VISTARIL) 50 MG capsule Take 50 mg by mouth 3 (three) times daily as needed for itching.    . latanoprost (XALATAN) 0.005 % ophthalmic solution Place 1 drop into the right eye at bedtime.    . metoprolol tartrate (LOPRESSOR) 25 MG tablet Take 1/2 tablet by mouth twice  daily. Patient is overdue for an appt and must call and schedule for further refills 3rd/final attempt 15 tablet 0  . Multiple Vitamin (MULTIVITAMIN) capsule Take 1 capsule by mouth daily.    . nitroGLYCERIN (NITROSTAT) 0.4 MG SL tablet Place 1 tablet (0.4 mg total) under the tongue every 5 (five) minutes as needed. 25 tablet 11  . Omega-3 Fatty Acids (OMEGA 3 PO) Take by mouth daily. Pt unsure of dosage    . omeprazole (PRILOSEC) 20 MG capsule Take 20 mg by mouth daily.    Marland Kitchen. oxyCODONE-acetaminophen (PERCOCET) 10-325 MG per tablet Take 1 tablet by mouth every 4 (four) hours as needed.     . Pomegranate, Punica granatum, (POMEGRANATE PO) Take by mouth daily. Pt unsure of dosage    . Sildenafil Citrate (VIAGRA PO) Take by mouth. As directed. Pt unsure of dosage    . simvastatin (ZOCOR) 20 MG tablet Take 20 mg by mouth daily.    Marland Kitchen. zolpidem (AMBIEN) 10 MG tablet Take 10 mg by mouth at bedtime as needed.     No  current facility-administered medications on file prior to visit.      Allergies  Allergen Reactions  . Lipitor [Atorvastatin Calcium]     Muscle Aches  . Lovastatin     Muscle Aches    Past Medical History:  Diagnosis Date  . Anxiety   . Arthritis   . Coronary artery disease    cabg x 4 in 2009  . Dyslipidemia   . Hypertension   . Obstructive sleep apnea     Past Surgical History:  Procedure Laterality Date  . APPENDECTOMY    . CARDIAC CATHETERIZATION  09/30/2007   cabg x 4  . CORONARY ARTERY BYPASS GRAFT  10/02/2007   x 4  . OTHER SURGICAL HISTORY     tonsilectomy    Social History   Tobacco Use  Smoking Status Former Smoker  . Types: Cigarettes  Smokeless Tobacco Never Used    Social History   Substance and Sexual Activity  Alcohol Use No    Family History  Problem Relation Age of Onset  . Cancer Mother        colon  . Heart attack Father   . Heart attack Brother     Reviw of Systems:  Reviewed in the HPI.  All other systems are negative.  Physical Exam: Blood pressure 122/60, pulse (!) 59, height 5\' 6"  (1.676 m), weight 171 lb (77.6 kg).  GEN:  Well nourished, well developed in no acute distress HEENT: Normal NECK: No JVD; No carotid bruits LYMPHATICS: No lymphadenopathy CARDIAC: RR, no murmurs, rubs, gallops RESPIRATORY:  Clear to auscultation without rales, wheezing or rhonchi  ABDOMEN: Soft, non-tender, non-distended MUSCULOSKELETAL:  No edema; No deformity  SKIN: Warm and dry NEUROLOGIC:  Alert and oriented x 3   ECG: December 06, 2016: Sinus bradycardia 59.  Otherwise the EKG is normal.  Assessment / Plan:   1. Coronary artery disease-status post CABG ( 2009) Michael Donaldson doing well.  He has not had any episodes of chest pain or shortness of breath.  Continue current medications.   2. Dyslipidemia -    he has his labs drawn at his primary medical doctor's office.  Most recent labs reveal: Cholesterol =  136 HDL is 68 LDL is  56 Triglycerides are 59  3. Hypertension -  BP is well controlled.     Kristeen MissPhilip Shamel Galyean, MD  12/06/2016 9:16 AM    [facility]  [address],  [place], [place]  [address] Pager [phone] Phone: [phone]; Fax: [phone]

## 2016-12-06 NOTE — Patient Instructions (Signed)

## 2016-12-16 DIAGNOSIS — M25511 Pain in right shoulder: Secondary | ICD-10-CM | POA: Diagnosis not present

## 2016-12-16 DIAGNOSIS — E291 Testicular hypofunction: Secondary | ICD-10-CM | POA: Diagnosis not present

## 2016-12-16 DIAGNOSIS — I10 Essential (primary) hypertension: Secondary | ICD-10-CM | POA: Diagnosis not present

## 2017-01-15 DIAGNOSIS — M25511 Pain in right shoulder: Secondary | ICD-10-CM | POA: Diagnosis not present

## 2017-01-15 DIAGNOSIS — E291 Testicular hypofunction: Secondary | ICD-10-CM | POA: Diagnosis not present

## 2017-01-15 DIAGNOSIS — G8929 Other chronic pain: Secondary | ICD-10-CM | POA: Diagnosis not present

## 2017-02-10 DIAGNOSIS — E291 Testicular hypofunction: Secondary | ICD-10-CM | POA: Diagnosis not present

## 2017-02-11 DIAGNOSIS — S3993XA Unspecified injury of pelvis, initial encounter: Secondary | ICD-10-CM | POA: Diagnosis not present

## 2017-02-11 DIAGNOSIS — M79641 Pain in right hand: Secondary | ICD-10-CM | POA: Diagnosis not present

## 2017-02-11 DIAGNOSIS — S6401XA Injury of ulnar nerve at wrist and hand level of right arm, initial encounter: Secondary | ICD-10-CM | POA: Diagnosis not present

## 2017-02-11 DIAGNOSIS — S6991XA Unspecified injury of right wrist, hand and finger(s), initial encounter: Secondary | ICD-10-CM | POA: Diagnosis not present

## 2017-02-11 DIAGNOSIS — T148XXA Other injury of unspecified body region, initial encounter: Secondary | ICD-10-CM | POA: Diagnosis not present

## 2017-02-11 DIAGNOSIS — S59901A Unspecified injury of right elbow, initial encounter: Secondary | ICD-10-CM | POA: Diagnosis not present

## 2017-02-11 DIAGNOSIS — S60211A Contusion of right wrist, initial encounter: Secondary | ICD-10-CM | POA: Diagnosis not present

## 2017-02-11 DIAGNOSIS — S6491XA Injury of unspecified nerve at wrist and hand level of right arm, initial encounter: Secondary | ICD-10-CM | POA: Diagnosis not present

## 2017-02-12 DIAGNOSIS — M25511 Pain in right shoulder: Secondary | ICD-10-CM | POA: Diagnosis not present

## 2017-02-12 DIAGNOSIS — R202 Paresthesia of skin: Secondary | ICD-10-CM | POA: Diagnosis not present

## 2017-02-12 DIAGNOSIS — R200 Anesthesia of skin: Secondary | ICD-10-CM | POA: Diagnosis not present

## 2017-02-17 ENCOUNTER — Other Ambulatory Visit: Payer: Self-pay | Admitting: Internal Medicine

## 2017-02-17 DIAGNOSIS — R202 Paresthesia of skin: Secondary | ICD-10-CM

## 2017-03-04 DIAGNOSIS — M25511 Pain in right shoulder: Secondary | ICD-10-CM | POA: Diagnosis not present

## 2017-03-04 DIAGNOSIS — Z5181 Encounter for therapeutic drug level monitoring: Secondary | ICD-10-CM | POA: Diagnosis not present

## 2017-03-04 DIAGNOSIS — R05 Cough: Secondary | ICD-10-CM | POA: Diagnosis not present

## 2017-03-06 ENCOUNTER — Encounter: Payer: Medicare Other | Admitting: Diagnostic Neuroimaging

## 2017-03-07 ENCOUNTER — Encounter: Payer: Self-pay | Admitting: Diagnostic Neuroimaging

## 2017-03-18 DIAGNOSIS — E291 Testicular hypofunction: Secondary | ICD-10-CM | POA: Diagnosis not present

## 2017-03-24 ENCOUNTER — Other Ambulatory Visit: Payer: Medicare Other

## 2017-04-02 DIAGNOSIS — M25511 Pain in right shoulder: Secondary | ICD-10-CM | POA: Diagnosis not present

## 2017-04-02 DIAGNOSIS — G8929 Other chronic pain: Secondary | ICD-10-CM | POA: Diagnosis not present

## 2017-04-04 DIAGNOSIS — M25511 Pain in right shoulder: Secondary | ICD-10-CM | POA: Diagnosis not present

## 2017-04-04 DIAGNOSIS — G8929 Other chronic pain: Secondary | ICD-10-CM | POA: Diagnosis not present

## 2017-04-10 DIAGNOSIS — M25511 Pain in right shoulder: Secondary | ICD-10-CM | POA: Diagnosis not present

## 2017-04-10 DIAGNOSIS — G8929 Other chronic pain: Secondary | ICD-10-CM | POA: Diagnosis not present

## 2017-04-11 DIAGNOSIS — Z5181 Encounter for therapeutic drug level monitoring: Secondary | ICD-10-CM | POA: Diagnosis not present

## 2017-04-23 DIAGNOSIS — Z5181 Encounter for therapeutic drug level monitoring: Secondary | ICD-10-CM | POA: Diagnosis not present

## 2017-04-23 DIAGNOSIS — G8929 Other chronic pain: Secondary | ICD-10-CM | POA: Diagnosis not present

## 2017-04-23 DIAGNOSIS — E291 Testicular hypofunction: Secondary | ICD-10-CM | POA: Diagnosis not present

## 2017-04-23 DIAGNOSIS — M25511 Pain in right shoulder: Secondary | ICD-10-CM | POA: Diagnosis not present

## 2017-05-19 DIAGNOSIS — E291 Testicular hypofunction: Secondary | ICD-10-CM | POA: Diagnosis not present

## 2017-05-30 DIAGNOSIS — G8929 Other chronic pain: Secondary | ICD-10-CM | POA: Diagnosis not present

## 2017-05-30 DIAGNOSIS — R52 Pain, unspecified: Secondary | ICD-10-CM | POA: Diagnosis not present

## 2017-05-30 DIAGNOSIS — Z7982 Long term (current) use of aspirin: Secondary | ICD-10-CM | POA: Diagnosis not present

## 2017-05-30 DIAGNOSIS — Z79899 Other long term (current) drug therapy: Secondary | ICD-10-CM | POA: Diagnosis not present

## 2017-05-30 DIAGNOSIS — R0602 Shortness of breath: Secondary | ICD-10-CM | POA: Diagnosis not present

## 2017-05-30 DIAGNOSIS — Z7984 Long term (current) use of oral hypoglycemic drugs: Secondary | ICD-10-CM | POA: Diagnosis not present

## 2017-05-30 DIAGNOSIS — I252 Old myocardial infarction: Secondary | ICD-10-CM | POA: Diagnosis not present

## 2017-05-30 DIAGNOSIS — Z951 Presence of aortocoronary bypass graft: Secondary | ICD-10-CM | POA: Diagnosis not present

## 2017-05-30 DIAGNOSIS — M25511 Pain in right shoulder: Secondary | ICD-10-CM | POA: Diagnosis not present

## 2017-06-09 ENCOUNTER — Other Ambulatory Visit: Payer: Self-pay

## 2017-06-09 NOTE — Patient Outreach (Signed)
[facility]) Care Management  06/09/2017  Michael Donaldson 1934-07-11 [id number]   Medication Adherence call to Mr. Michael Donaldson patient is showing past due under [facility] Ins.on Two of his medication Simvastatin 20 mg and Metformin 500 mg patient did not answer left a message for patient to call back.  Lillia Abed CPhT Pharmacy Technician [facility] Care Management Direct Dial [phone]  Fax [phone] [email or web address]

## 2017-06-26 ENCOUNTER — Telehealth: Payer: Self-pay | Admitting: Pharmacist

## 2017-06-26 NOTE — Patient Outreach (Signed)
[facility]) Care Management  06/26/2017  Michael Donaldson 25-Aug-1934 [id number]  83 year old male on triple-fail measure medication adherence list from [facility].  [facility], the following medications are due to be refilled.     Metformin: last filled 05/08/2017 #30 DS  Losartan: last filled 05/22/2017 #30 DS  Simvastatin: last filled 03/12/2017 #30DS  Patient previously called by [facility] pharmacy technician on 06/09/2017 and message left for patient requesting return call.  Unsuccessful call attempt #2 to Michael Donaldson today regarding medication adherence.  Primary home phone number is no longer in service.  I left a HIPAA compliant voicemail on the mobile phone.  2nd home phone number was not answered and no voicemail available to leave message.   Plan: I will make call attempt #3 to Michael Donaldson next week regarding medication adherence.   Haynes Hoehn, PharmD, [facility] Clinical Pharmacist [facility] [employer] [phone]

## 2017-07-01 ENCOUNTER — Other Ambulatory Visit: Payer: Self-pay | Admitting: Pharmacist

## 2017-07-01 ENCOUNTER — Ambulatory Visit: Payer: Self-pay | Admitting: Pharmacist

## 2017-07-01 NOTE — Patient Outreach (Signed)
[facility]) Care Management  07/01/2017  Michael Donaldson 08/07/1934 [id number]   Unsuccessful 3rd outreach attempt to reach [name] regarding medication adherence.    Plan:  I will close [facility] pharmacy case at this time due to inability to engage with patient.   Haynes Hoehnolleen Arianna Delsanto, PharmD, [facility] Clinical Pharmacist [facility] [phone]

## 2017-07-29 ENCOUNTER — Other Ambulatory Visit: Payer: Self-pay

## 2017-07-29 NOTE — Patient Outreach (Signed)
[facility]) Care Management  07/29/2017  Erling Contehillip J Vanbergen 09/09/1934 [id number]   Medication Adherence call to Mr. Gus Heighthillip Sartin left a message for patient to call back patient is due on Metformin 500 mg and Losartan 25 mg .Pharmacy said patient has not pick up for this month.Mr. Ether GriffinsFowler is showing past due under [facility] Ins.  Lillia AbedAna Ollison-Moran CPhT Pharmacy Technician [facility] Care Management Direct Dial [phone]  Fax [phone] [email or web address]

## 2017-08-07 DIAGNOSIS — M25519 Pain in unspecified shoulder: Secondary | ICD-10-CM | POA: Diagnosis not present

## 2017-08-07 DIAGNOSIS — M150 Primary generalized (osteo)arthritis: Secondary | ICD-10-CM | POA: Diagnosis not present

## 2017-08-07 DIAGNOSIS — G4700 Insomnia, unspecified: Secondary | ICD-10-CM | POA: Diagnosis not present

## 2017-08-07 DIAGNOSIS — M545 Low back pain: Secondary | ICD-10-CM | POA: Diagnosis not present

## 2017-08-12 DIAGNOSIS — M159 Polyosteoarthritis, unspecified: Secondary | ICD-10-CM | POA: Diagnosis not present

## 2017-08-12 DIAGNOSIS — M7500 Adhesive capsulitis of unspecified shoulder: Secondary | ICD-10-CM | POA: Diagnosis not present

## 2017-08-12 DIAGNOSIS — M7580 Other shoulder lesions, unspecified shoulder: Secondary | ICD-10-CM | POA: Diagnosis not present

## 2017-08-12 DIAGNOSIS — M25511 Pain in right shoulder: Secondary | ICD-10-CM | POA: Diagnosis not present

## 2017-08-14 ENCOUNTER — Other Ambulatory Visit: Payer: Self-pay | Admitting: Pharmacist

## 2017-08-14 NOTE — Patient Outreach (Signed)
[facility]) Care Management  08/14/2017  Erling Contehillip J Firebaugh 04/21/1934 [id number]  83 year old male on triple-fail measure medication adherence [facility].  [facility], the following medications are due to be refilled.     Metformin  Losartan  Simvastatin  Unsuccessful call attempt to Mr. Kynard.  I left a HIPAA compliant voicemail on the mobile phone.  The home phone numbers listed were disconnected or did not have a voicemail.   Plan: I will reach out to Mr. Ether GriffinsFowler again next week if I do not hear back beforehand.   Haynes Hoehnolleen Sevyn Markham, PharmD, [facility] Clinical Pharmacist [facility] [phone]

## 2017-08-18 ENCOUNTER — Other Ambulatory Visit: Payer: Self-pay | Admitting: Pharmacist

## 2017-08-18 NOTE — Patient Outreach (Signed)
[facility]) Care Management  08/18/2017  Erling Contehillip J Baller 08/13/1934 [id number]   Unsuccessful call attempt to Mr. Chern regarding medication adherence.  Mobile phone voicemail was full and not accepting messages.  Home phone numbers were disconnected or did not have voicemail.    Plan: I will reach out to Mr. Ether GriffinsFowler again later this week.   Haynes Hoehnolleen Anicia Leuthold, PharmD, [facility] Clinical Pharmacist [facility] [phone]

## 2017-08-21 ENCOUNTER — Other Ambulatory Visit: Payer: Self-pay | Admitting: Pharmacist

## 2017-08-21 NOTE — Patient Outreach (Signed)
[facility]) Care Management  08/21/2017  Michael Donaldson 10/14/1934 [id number]   3rd unsuccessful call attempt to [name] regarding medication adherence.  Mobile phone voicemail was full and not accepting messages.  Home phone numbers were disconnected or did not have voicemail.    I will close [facility] case at this time due to inability to engage with patient.   Haynes Hoehnolleen Zanai Mallari, PharmD, [facility] Clinical Pharmacist [facility] [phone]

## 2017-08-28 DIAGNOSIS — E291 Testicular hypofunction: Secondary | ICD-10-CM | POA: Diagnosis not present

## 2017-10-07 DIAGNOSIS — Z79899 Other long term (current) drug therapy: Secondary | ICD-10-CM | POA: Diagnosis not present

## 2017-10-30 DIAGNOSIS — K219 Gastro-esophageal reflux disease without esophagitis: Secondary | ICD-10-CM | POA: Diagnosis not present

## 2017-10-30 DIAGNOSIS — Z23 Encounter for immunization: Secondary | ICD-10-CM | POA: Diagnosis not present

## 2017-10-30 DIAGNOSIS — M25511 Pain in right shoulder: Secondary | ICD-10-CM | POA: Diagnosis not present

## 2017-10-30 DIAGNOSIS — G4700 Insomnia, unspecified: Secondary | ICD-10-CM | POA: Diagnosis not present

## 2017-10-30 DIAGNOSIS — M150 Primary generalized (osteo)arthritis: Secondary | ICD-10-CM | POA: Diagnosis not present

## 2017-10-30 DIAGNOSIS — Z79899 Other long term (current) drug therapy: Secondary | ICD-10-CM | POA: Diagnosis not present

## 2017-12-15 DIAGNOSIS — M19019 Primary osteoarthritis, unspecified shoulder: Secondary | ICD-10-CM | POA: Diagnosis not present

## 2017-12-15 DIAGNOSIS — Z79899 Other long term (current) drug therapy: Secondary | ICD-10-CM | POA: Diagnosis not present

## 2017-12-15 DIAGNOSIS — Z79891 Long term (current) use of opiate analgesic: Secondary | ICD-10-CM | POA: Diagnosis not present

## 2017-12-15 DIAGNOSIS — G894 Chronic pain syndrome: Secondary | ICD-10-CM | POA: Diagnosis not present

## 2017-12-15 DIAGNOSIS — M545 Low back pain: Secondary | ICD-10-CM | POA: Diagnosis not present

## 2017-12-15 DIAGNOSIS — M25511 Pain in right shoulder: Secondary | ICD-10-CM | POA: Diagnosis not present

## 2017-12-30 ENCOUNTER — Other Ambulatory Visit: Payer: Self-pay | Admitting: Cardiovascular Disease

## 2018-02-26 ENCOUNTER — Other Ambulatory Visit: Payer: Self-pay | Admitting: Cardiovascular Disease

## 2018-03-22 DIAGNOSIS — R52 Pain, unspecified: Secondary | ICD-10-CM | POA: Diagnosis not present

## 2018-03-22 DIAGNOSIS — K5900 Constipation, unspecified: Secondary | ICD-10-CM | POA: Diagnosis not present

## 2018-03-22 DIAGNOSIS — Z951 Presence of aortocoronary bypass graft: Secondary | ICD-10-CM | POA: Diagnosis not present

## 2018-03-22 DIAGNOSIS — K5641 Fecal impaction: Secondary | ICD-10-CM | POA: Diagnosis not present

## 2018-03-22 DIAGNOSIS — I252 Old myocardial infarction: Secondary | ICD-10-CM | POA: Diagnosis not present

## 2018-03-22 DIAGNOSIS — I10 Essential (primary) hypertension: Secondary | ICD-10-CM | POA: Diagnosis not present

## 2018-03-22 DIAGNOSIS — N200 Calculus of kidney: Secondary | ICD-10-CM | POA: Diagnosis not present

## 2018-04-01 DIAGNOSIS — I10 Essential (primary) hypertension: Secondary | ICD-10-CM | POA: Diagnosis not present

## 2018-04-01 DIAGNOSIS — R739 Hyperglycemia, unspecified: Secondary | ICD-10-CM | POA: Diagnosis not present

## 2018-04-01 DIAGNOSIS — Z79899 Other long term (current) drug therapy: Secondary | ICD-10-CM | POA: Diagnosis not present

## 2018-04-01 DIAGNOSIS — R7309 Other abnormal glucose: Secondary | ICD-10-CM | POA: Diagnosis not present

## 2018-04-16 DIAGNOSIS — Z79899 Other long term (current) drug therapy: Secondary | ICD-10-CM | POA: Diagnosis not present

## 2018-04-16 DIAGNOSIS — M25512 Pain in left shoulder: Secondary | ICD-10-CM | POA: Diagnosis not present

## 2018-04-16 DIAGNOSIS — M25511 Pain in right shoulder: Secondary | ICD-10-CM | POA: Diagnosis not present

## 2018-04-16 DIAGNOSIS — M545 Low back pain: Secondary | ICD-10-CM | POA: Diagnosis not present

## 2018-04-16 DIAGNOSIS — G8929 Other chronic pain: Secondary | ICD-10-CM | POA: Diagnosis not present

## 2018-04-28 ENCOUNTER — Other Ambulatory Visit: Payer: Self-pay | Admitting: Cardiovascular Disease

## 2018-05-31 DIAGNOSIS — R112 Nausea with vomiting, unspecified: Secondary | ICD-10-CM | POA: Diagnosis not present

## 2018-05-31 DIAGNOSIS — G4730 Sleep apnea, unspecified: Secondary | ICD-10-CM | POA: Diagnosis not present

## 2018-05-31 DIAGNOSIS — Z7982 Long term (current) use of aspirin: Secondary | ICD-10-CM | POA: Diagnosis not present

## 2018-05-31 DIAGNOSIS — Z951 Presence of aortocoronary bypass graft: Secondary | ICD-10-CM | POA: Diagnosis not present

## 2018-05-31 DIAGNOSIS — K5730 Diverticulosis of large intestine without perforation or abscess without bleeding: Secondary | ICD-10-CM | POA: Diagnosis not present

## 2018-05-31 DIAGNOSIS — I959 Hypotension, unspecified: Secondary | ICD-10-CM | POA: Diagnosis not present

## 2018-05-31 DIAGNOSIS — R1110 Vomiting, unspecified: Secondary | ICD-10-CM | POA: Diagnosis not present

## 2018-05-31 DIAGNOSIS — I2510 Atherosclerotic heart disease of native coronary artery without angina pectoris: Secondary | ICD-10-CM | POA: Diagnosis not present

## 2018-05-31 DIAGNOSIS — Z87891 Personal history of nicotine dependence: Secondary | ICD-10-CM | POA: Diagnosis not present

## 2018-05-31 DIAGNOSIS — N200 Calculus of kidney: Secondary | ICD-10-CM | POA: Diagnosis not present

## 2018-05-31 DIAGNOSIS — R109 Unspecified abdominal pain: Secondary | ICD-10-CM | POA: Diagnosis not present

## 2018-05-31 DIAGNOSIS — E785 Hyperlipidemia, unspecified: Secondary | ICD-10-CM | POA: Diagnosis not present

## 2018-05-31 DIAGNOSIS — K449 Diaphragmatic hernia without obstruction or gangrene: Secondary | ICD-10-CM | POA: Diagnosis not present

## 2018-05-31 DIAGNOSIS — R579 Shock, unspecified: Secondary | ICD-10-CM | POA: Diagnosis not present

## 2018-05-31 DIAGNOSIS — D649 Anemia, unspecified: Secondary | ICD-10-CM | POA: Diagnosis not present

## 2018-05-31 DIAGNOSIS — Z9889 Other specified postprocedural states: Secondary | ICD-10-CM | POA: Diagnosis not present

## 2018-05-31 DIAGNOSIS — K922 Gastrointestinal hemorrhage, unspecified: Secondary | ICD-10-CM | POA: Diagnosis not present

## 2018-05-31 DIAGNOSIS — K920 Hematemesis: Secondary | ICD-10-CM | POA: Diagnosis not present

## 2018-05-31 DIAGNOSIS — R231 Pallor: Secondary | ICD-10-CM | POA: Diagnosis not present

## 2018-05-31 DIAGNOSIS — K921 Melena: Secondary | ICD-10-CM | POA: Diagnosis not present

## 2018-05-31 DIAGNOSIS — K264 Chronic or unspecified duodenal ulcer with hemorrhage: Secondary | ICD-10-CM | POA: Diagnosis not present

## 2018-05-31 DIAGNOSIS — R8589 Other abnormal findings in specimens from digestive organs and abdominal cavity: Secondary | ICD-10-CM | POA: Diagnosis not present

## 2018-05-31 DIAGNOSIS — Z79899 Other long term (current) drug therapy: Secondary | ICD-10-CM | POA: Diagnosis not present

## 2018-05-31 DIAGNOSIS — K269 Duodenal ulcer, unspecified as acute or chronic, without hemorrhage or perforation: Secondary | ICD-10-CM | POA: Diagnosis not present

## 2018-05-31 DIAGNOSIS — R55 Syncope and collapse: Secondary | ICD-10-CM | POA: Diagnosis not present

## 2018-05-31 DIAGNOSIS — K254 Chronic or unspecified gastric ulcer with hemorrhage: Secondary | ICD-10-CM | POA: Diagnosis not present

## 2018-05-31 DIAGNOSIS — D62 Acute posthemorrhagic anemia: Secondary | ICD-10-CM | POA: Diagnosis not present

## 2018-05-31 DIAGNOSIS — R4020 Unspecified coma: Secondary | ICD-10-CM | POA: Diagnosis not present

## 2018-05-31 DIAGNOSIS — D500 Iron deficiency anemia secondary to blood loss (chronic): Secondary | ICD-10-CM | POA: Diagnosis not present

## 2018-05-31 DIAGNOSIS — I10 Essential (primary) hypertension: Secondary | ICD-10-CM | POA: Diagnosis not present

## 2018-06-16 DIAGNOSIS — G8929 Other chronic pain: Secondary | ICD-10-CM | POA: Diagnosis not present

## 2018-06-16 DIAGNOSIS — K922 Gastrointestinal hemorrhage, unspecified: Secondary | ICD-10-CM | POA: Diagnosis not present

## 2018-06-16 DIAGNOSIS — M25511 Pain in right shoulder: Secondary | ICD-10-CM | POA: Diagnosis not present

## 2018-06-16 DIAGNOSIS — Z7189 Other specified counseling: Secondary | ICD-10-CM | POA: Diagnosis not present

## 2018-08-12 DIAGNOSIS — K219 Gastro-esophageal reflux disease without esophagitis: Secondary | ICD-10-CM | POA: Diagnosis not present

## 2018-08-12 DIAGNOSIS — R739 Hyperglycemia, unspecified: Secondary | ICD-10-CM | POA: Diagnosis not present

## 2018-08-12 DIAGNOSIS — I10 Essential (primary) hypertension: Secondary | ICD-10-CM | POA: Diagnosis not present

## 2018-08-12 DIAGNOSIS — M159 Polyosteoarthritis, unspecified: Secondary | ICD-10-CM | POA: Diagnosis not present

## 2018-08-27 DIAGNOSIS — Z79899 Other long term (current) drug therapy: Secondary | ICD-10-CM | POA: Diagnosis not present

## 2018-08-27 DIAGNOSIS — K219 Gastro-esophageal reflux disease without esophagitis: Secondary | ICD-10-CM | POA: Diagnosis not present

## 2018-08-27 DIAGNOSIS — R739 Hyperglycemia, unspecified: Secondary | ICD-10-CM | POA: Diagnosis not present

## 2018-08-27 DIAGNOSIS — M545 Low back pain: Secondary | ICD-10-CM | POA: Diagnosis not present

## 2018-08-27 DIAGNOSIS — I10 Essential (primary) hypertension: Secondary | ICD-10-CM | POA: Diagnosis not present

## 2018-08-27 DIAGNOSIS — D649 Anemia, unspecified: Secondary | ICD-10-CM | POA: Diagnosis not present

## 2018-08-27 DIAGNOSIS — M549 Dorsalgia, unspecified: Secondary | ICD-10-CM | POA: Diagnosis not present

## 2018-09-11 DIAGNOSIS — R5383 Other fatigue: Secondary | ICD-10-CM | POA: Diagnosis not present

## 2018-09-11 DIAGNOSIS — M25512 Pain in left shoulder: Secondary | ICD-10-CM | POA: Diagnosis not present

## 2018-09-11 DIAGNOSIS — D649 Anemia, unspecified: Secondary | ICD-10-CM | POA: Diagnosis not present

## 2018-09-11 DIAGNOSIS — M159 Polyosteoarthritis, unspecified: Secondary | ICD-10-CM | POA: Diagnosis not present

## 2018-09-29 DIAGNOSIS — K264 Chronic or unspecified duodenal ulcer with hemorrhage: Secondary | ICD-10-CM | POA: Diagnosis not present

## 2018-10-01 DIAGNOSIS — R5383 Other fatigue: Secondary | ICD-10-CM | POA: Diagnosis not present

## 2018-10-29 DIAGNOSIS — Z23 Encounter for immunization: Secondary | ICD-10-CM | POA: Diagnosis not present

## 2018-11-18 DIAGNOSIS — R739 Hyperglycemia, unspecified: Secondary | ICD-10-CM | POA: Diagnosis not present

## 2018-11-18 DIAGNOSIS — M545 Low back pain: Secondary | ICD-10-CM | POA: Diagnosis not present

## 2018-11-18 DIAGNOSIS — E7800 Pure hypercholesterolemia, unspecified: Secondary | ICD-10-CM | POA: Diagnosis not present

## 2018-11-18 DIAGNOSIS — I10 Essential (primary) hypertension: Secondary | ICD-10-CM | POA: Diagnosis not present

## 2018-11-18 DIAGNOSIS — M25511 Pain in right shoulder: Secondary | ICD-10-CM | POA: Diagnosis not present

## 2019-02-16 DIAGNOSIS — R739 Hyperglycemia, unspecified: Secondary | ICD-10-CM | POA: Diagnosis not present

## 2019-02-16 DIAGNOSIS — I10 Essential (primary) hypertension: Secondary | ICD-10-CM | POA: Diagnosis not present

## 2019-02-16 DIAGNOSIS — M545 Low back pain: Secondary | ICD-10-CM | POA: Diagnosis not present

## 2019-02-16 DIAGNOSIS — Z79899 Other long term (current) drug therapy: Secondary | ICD-10-CM | POA: Diagnosis not present

## 2019-02-18 DIAGNOSIS — M545 Low back pain: Secondary | ICD-10-CM | POA: Diagnosis not present

## 2019-02-18 DIAGNOSIS — I10 Essential (primary) hypertension: Secondary | ICD-10-CM | POA: Diagnosis not present

## 2019-02-18 DIAGNOSIS — M25511 Pain in right shoulder: Secondary | ICD-10-CM | POA: Diagnosis not present

## 2019-02-18 DIAGNOSIS — E7800 Pure hypercholesterolemia, unspecified: Secondary | ICD-10-CM | POA: Diagnosis not present

## 2019-02-22 ENCOUNTER — Other Ambulatory Visit: Payer: Self-pay | Admitting: Cardiovascular Disease

## 2019-04-21 ENCOUNTER — Encounter: Payer: Self-pay | Admitting: General Practice

## 2019-06-07 DIAGNOSIS — E7800 Pure hypercholesterolemia, unspecified: Secondary | ICD-10-CM | POA: Diagnosis not present

## 2019-06-07 DIAGNOSIS — E119 Type 2 diabetes mellitus without complications: Secondary | ICD-10-CM | POA: Diagnosis not present

## 2019-06-07 DIAGNOSIS — G4733 Obstructive sleep apnea (adult) (pediatric): Secondary | ICD-10-CM | POA: Diagnosis not present

## 2019-06-07 DIAGNOSIS — I10 Essential (primary) hypertension: Secondary | ICD-10-CM | POA: Diagnosis not present

## 2019-06-30 DIAGNOSIS — N3090 Cystitis, unspecified without hematuria: Secondary | ICD-10-CM | POA: Diagnosis not present

## 2019-06-30 DIAGNOSIS — N3001 Acute cystitis with hematuria: Secondary | ICD-10-CM | POA: Diagnosis not present

## 2019-07-12 DIAGNOSIS — R531 Weakness: Secondary | ICD-10-CM | POA: Diagnosis not present

## 2019-07-12 DIAGNOSIS — R61 Generalized hyperhidrosis: Secondary | ICD-10-CM | POA: Diagnosis not present

## 2019-07-12 DIAGNOSIS — R300 Dysuria: Secondary | ICD-10-CM | POA: Diagnosis not present

## 2019-07-12 DIAGNOSIS — R0902 Hypoxemia: Secondary | ICD-10-CM | POA: Diagnosis not present

## 2019-07-12 DIAGNOSIS — I700 Atherosclerosis of aorta: Secondary | ICD-10-CM | POA: Diagnosis not present

## 2019-07-12 DIAGNOSIS — Z743 Need for continuous supervision: Secondary | ICD-10-CM | POA: Diagnosis not present

## 2019-07-12 DIAGNOSIS — R52 Pain, unspecified: Secondary | ICD-10-CM | POA: Diagnosis not present

## 2019-07-13 DIAGNOSIS — Z743 Need for continuous supervision: Secondary | ICD-10-CM | POA: Diagnosis not present

## 2019-07-13 DIAGNOSIS — R5381 Other malaise: Secondary | ICD-10-CM | POA: Diagnosis not present

## 2019-07-13 DIAGNOSIS — R52 Pain, unspecified: Secondary | ICD-10-CM | POA: Diagnosis not present

## 2019-07-16 DIAGNOSIS — R339 Retention of urine, unspecified: Secondary | ICD-10-CM | POA: Diagnosis not present

## 2019-07-19 DIAGNOSIS — N138 Other obstructive and reflux uropathy: Secondary | ICD-10-CM | POA: Diagnosis not present

## 2019-07-19 DIAGNOSIS — R339 Retention of urine, unspecified: Secondary | ICD-10-CM | POA: Diagnosis not present

## 2019-09-02 DIAGNOSIS — K279 Peptic ulcer, site unspecified, unspecified as acute or chronic, without hemorrhage or perforation: Secondary | ICD-10-CM | POA: Diagnosis not present

## 2019-09-02 DIAGNOSIS — R066 Hiccough: Secondary | ICD-10-CM | POA: Diagnosis not present

## 2019-09-02 DIAGNOSIS — K219 Gastro-esophageal reflux disease without esophagitis: Secondary | ICD-10-CM | POA: Diagnosis not present

## 2019-09-02 DIAGNOSIS — R12 Heartburn: Secondary | ICD-10-CM | POA: Diagnosis not present

## 2019-09-09 DIAGNOSIS — Z79899 Other long term (current) drug therapy: Secondary | ICD-10-CM | POA: Diagnosis not present

## 2019-09-09 DIAGNOSIS — I10 Essential (primary) hypertension: Secondary | ICD-10-CM | POA: Diagnosis not present

## 2019-09-09 DIAGNOSIS — M545 Low back pain: Secondary | ICD-10-CM | POA: Diagnosis not present

## 2019-09-09 DIAGNOSIS — R7303 Prediabetes: Secondary | ICD-10-CM | POA: Diagnosis not present

## 2019-09-29 DIAGNOSIS — E7800 Pure hypercholesterolemia, unspecified: Secondary | ICD-10-CM | POA: Diagnosis not present

## 2019-09-29 DIAGNOSIS — Z0000 Encounter for general adult medical examination without abnormal findings: Secondary | ICD-10-CM | POA: Diagnosis not present

## 2019-10-21 DIAGNOSIS — R8271 Bacteriuria: Secondary | ICD-10-CM | POA: Diagnosis not present

## 2019-10-21 DIAGNOSIS — R6883 Chills (without fever): Secondary | ICD-10-CM | POA: Diagnosis not present

## 2019-10-21 DIAGNOSIS — R531 Weakness: Secondary | ICD-10-CM | POA: Diagnosis not present

## 2019-10-21 DIAGNOSIS — R509 Fever, unspecified: Secondary | ICD-10-CM | POA: Diagnosis not present

## 2019-10-21 DIAGNOSIS — N3001 Acute cystitis with hematuria: Secondary | ICD-10-CM | POA: Diagnosis not present

## 2019-10-21 DIAGNOSIS — E860 Dehydration: Secondary | ICD-10-CM | POA: Diagnosis not present

## 2019-11-01 DIAGNOSIS — R52 Pain, unspecified: Secondary | ICD-10-CM | POA: Diagnosis not present

## 2019-11-01 DIAGNOSIS — Z87891 Personal history of nicotine dependence: Secondary | ICD-10-CM | POA: Diagnosis not present

## 2019-11-01 DIAGNOSIS — N183 Chronic kidney disease, stage 3 unspecified: Secondary | ICD-10-CM | POA: Diagnosis not present

## 2019-11-01 DIAGNOSIS — Z79899 Other long term (current) drug therapy: Secondary | ICD-10-CM | POA: Diagnosis not present

## 2019-11-01 DIAGNOSIS — Z743 Need for continuous supervision: Secondary | ICD-10-CM | POA: Diagnosis not present

## 2019-11-01 DIAGNOSIS — R079 Chest pain, unspecified: Secondary | ICD-10-CM | POA: Diagnosis not present

## 2019-11-01 DIAGNOSIS — I491 Atrial premature depolarization: Secondary | ICD-10-CM | POA: Diagnosis not present

## 2019-11-01 DIAGNOSIS — I129 Hypertensive chronic kidney disease with stage 1 through stage 4 chronic kidney disease, or unspecified chronic kidney disease: Secondary | ICD-10-CM | POA: Diagnosis not present

## 2019-11-01 DIAGNOSIS — I2510 Atherosclerotic heart disease of native coronary artery without angina pectoris: Secondary | ICD-10-CM | POA: Diagnosis not present

## 2019-11-01 DIAGNOSIS — G4733 Obstructive sleep apnea (adult) (pediatric): Secondary | ICD-10-CM | POA: Diagnosis not present

## 2019-11-01 DIAGNOSIS — E785 Hyperlipidemia, unspecified: Secondary | ICD-10-CM | POA: Diagnosis not present

## 2019-11-01 DIAGNOSIS — M19011 Primary osteoarthritis, right shoulder: Secondary | ICD-10-CM | POA: Diagnosis not present

## 2019-11-01 DIAGNOSIS — Z951 Presence of aortocoronary bypass graft: Secondary | ICD-10-CM | POA: Diagnosis not present

## 2019-11-01 DIAGNOSIS — R0789 Other chest pain: Secondary | ICD-10-CM | POA: Diagnosis not present

## 2019-11-01 DIAGNOSIS — I083 Combined rheumatic disorders of mitral, aortic and tricuspid valves: Secondary | ICD-10-CM | POA: Diagnosis not present

## 2019-11-01 DIAGNOSIS — R0781 Pleurodynia: Secondary | ICD-10-CM | POA: Diagnosis not present

## 2019-11-01 DIAGNOSIS — N390 Urinary tract infection, site not specified: Secondary | ICD-10-CM | POA: Diagnosis not present

## 2019-11-01 DIAGNOSIS — M791 Myalgia, unspecified site: Secondary | ICD-10-CM | POA: Diagnosis not present

## 2019-11-01 DIAGNOSIS — M549 Dorsalgia, unspecified: Secondary | ICD-10-CM | POA: Diagnosis not present

## 2019-11-01 DIAGNOSIS — Z9119 Patient's noncompliance with other medical treatment and regimen: Secondary | ICD-10-CM | POA: Diagnosis not present

## 2019-11-01 DIAGNOSIS — G894 Chronic pain syndrome: Secondary | ICD-10-CM | POA: Diagnosis not present

## 2019-11-01 DIAGNOSIS — M5459 Other low back pain: Secondary | ICD-10-CM | POA: Diagnosis not present

## 2019-11-01 DIAGNOSIS — G4700 Insomnia, unspecified: Secondary | ICD-10-CM | POA: Diagnosis not present

## 2019-11-01 DIAGNOSIS — R110 Nausea: Secondary | ICD-10-CM | POA: Diagnosis not present

## 2019-11-01 DIAGNOSIS — I252 Old myocardial infarction: Secondary | ICD-10-CM | POA: Diagnosis not present

## 2019-11-02 DIAGNOSIS — N390 Urinary tract infection, site not specified: Secondary | ICD-10-CM | POA: Diagnosis not present

## 2019-11-02 DIAGNOSIS — I491 Atrial premature depolarization: Secondary | ICD-10-CM | POA: Diagnosis not present

## 2019-11-02 DIAGNOSIS — I10 Essential (primary) hypertension: Secondary | ICD-10-CM | POA: Diagnosis not present

## 2019-11-02 DIAGNOSIS — I2510 Atherosclerotic heart disease of native coronary artery without angina pectoris: Secondary | ICD-10-CM | POA: Diagnosis not present

## 2019-11-02 DIAGNOSIS — R079 Chest pain, unspecified: Secondary | ICD-10-CM | POA: Diagnosis not present

## 2019-11-02 DIAGNOSIS — R0789 Other chest pain: Secondary | ICD-10-CM | POA: Diagnosis not present

## 2019-11-03 DIAGNOSIS — R0789 Other chest pain: Secondary | ICD-10-CM | POA: Diagnosis not present

## 2019-11-03 DIAGNOSIS — I129 Hypertensive chronic kidney disease with stage 1 through stage 4 chronic kidney disease, or unspecified chronic kidney disease: Secondary | ICD-10-CM | POA: Diagnosis not present

## 2019-11-03 DIAGNOSIS — I2510 Atherosclerotic heart disease of native coronary artery without angina pectoris: Secondary | ICD-10-CM | POA: Diagnosis not present

## 2019-11-03 DIAGNOSIS — N183 Chronic kidney disease, stage 3 unspecified: Secondary | ICD-10-CM | POA: Diagnosis not present

## 2019-11-11 DIAGNOSIS — R251 Tremor, unspecified: Secondary | ICD-10-CM | POA: Diagnosis not present

## 2019-11-11 DIAGNOSIS — Z79891 Long term (current) use of opiate analgesic: Secondary | ICD-10-CM | POA: Diagnosis not present

## 2019-11-11 DIAGNOSIS — R450 Nervousness: Secondary | ICD-10-CM | POA: Diagnosis not present

## 2019-12-30 ENCOUNTER — Other Ambulatory Visit: Payer: Self-pay

## 2020-01-04 DIAGNOSIS — I2510 Atherosclerotic heart disease of native coronary artery without angina pectoris: Secondary | ICD-10-CM | POA: Diagnosis not present

## 2020-01-04 DIAGNOSIS — E119 Type 2 diabetes mellitus without complications: Secondary | ICD-10-CM | POA: Diagnosis not present

## 2020-01-04 DIAGNOSIS — Z79891 Long term (current) use of opiate analgesic: Secondary | ICD-10-CM | POA: Diagnosis not present

## 2020-01-04 DIAGNOSIS — E7800 Pure hypercholesterolemia, unspecified: Secondary | ICD-10-CM | POA: Diagnosis not present

## 2020-01-04 DIAGNOSIS — M5137 Other intervertebral disc degeneration, lumbosacral region: Secondary | ICD-10-CM | POA: Diagnosis not present

## 2020-01-04 DIAGNOSIS — Z0000 Encounter for general adult medical examination without abnormal findings: Secondary | ICD-10-CM | POA: Diagnosis not present

## 2020-01-04 DIAGNOSIS — I10 Essential (primary) hypertension: Secondary | ICD-10-CM | POA: Diagnosis not present

## 2020-01-04 DIAGNOSIS — M545 Low back pain, unspecified: Secondary | ICD-10-CM | POA: Diagnosis not present

## 2020-01-24 ENCOUNTER — Encounter: Payer: Self-pay | Admitting: *Deleted

## 2020-01-24 ENCOUNTER — Other Ambulatory Visit: Payer: Self-pay | Admitting: *Deleted

## 2020-01-24 DIAGNOSIS — Z79899 Other long term (current) drug therapy: Secondary | ICD-10-CM | POA: Diagnosis not present

## 2020-01-24 DIAGNOSIS — M5137 Other intervertebral disc degeneration, lumbosacral region: Secondary | ICD-10-CM | POA: Diagnosis not present

## 2020-01-25 ENCOUNTER — Encounter: Payer: Self-pay | Admitting: Diagnostic Neuroimaging

## 2020-01-25 ENCOUNTER — Ambulatory Visit: Payer: Medicare Other | Admitting: Diagnostic Neuroimaging

## 2020-01-25 ENCOUNTER — Telehealth: Payer: Self-pay | Admitting: *Deleted

## 2020-01-25 NOTE — Telephone Encounter (Signed)
Patient was no show for new patient appointment today. 

## 2020-02-08 DIAGNOSIS — Z79899 Other long term (current) drug therapy: Secondary | ICD-10-CM | POA: Diagnosis not present

## 2020-02-08 DIAGNOSIS — M5137 Other intervertebral disc degeneration, lumbosacral region: Secondary | ICD-10-CM | POA: Diagnosis not present

## 2020-02-14 DIAGNOSIS — Z743 Need for continuous supervision: Secondary | ICD-10-CM | POA: Diagnosis not present

## 2020-02-14 DIAGNOSIS — M545 Low back pain, unspecified: Secondary | ICD-10-CM | POA: Diagnosis not present

## 2020-02-14 DIAGNOSIS — R52 Pain, unspecified: Secondary | ICD-10-CM | POA: Diagnosis not present

## 2020-02-14 DIAGNOSIS — M5416 Radiculopathy, lumbar region: Secondary | ICD-10-CM | POA: Diagnosis not present

## 2020-02-14 DIAGNOSIS — R58 Hemorrhage, not elsewhere classified: Secondary | ICD-10-CM | POA: Diagnosis not present

## 2020-02-14 DIAGNOSIS — M549 Dorsalgia, unspecified: Secondary | ICD-10-CM | POA: Diagnosis not present

## 2020-02-15 DIAGNOSIS — Z743 Need for continuous supervision: Secondary | ICD-10-CM | POA: Diagnosis not present

## 2020-02-15 DIAGNOSIS — R5381 Other malaise: Secondary | ICD-10-CM | POA: Diagnosis not present

## 2020-02-15 DIAGNOSIS — R197 Diarrhea, unspecified: Secondary | ICD-10-CM | POA: Diagnosis not present

## 2020-02-15 DIAGNOSIS — R279 Unspecified lack of coordination: Secondary | ICD-10-CM | POA: Diagnosis not present

## 2020-02-28 DIAGNOSIS — R1013 Epigastric pain: Secondary | ICD-10-CM | POA: Diagnosis not present

## 2020-02-28 DIAGNOSIS — K279 Peptic ulcer, site unspecified, unspecified as acute or chronic, without hemorrhage or perforation: Secondary | ICD-10-CM | POA: Diagnosis not present

## 2020-02-28 DIAGNOSIS — Z7982 Long term (current) use of aspirin: Secondary | ICD-10-CM | POA: Diagnosis not present

## 2020-02-28 DIAGNOSIS — Z743 Need for continuous supervision: Secondary | ICD-10-CM | POA: Diagnosis not present

## 2020-02-28 DIAGNOSIS — N200 Calculus of kidney: Secondary | ICD-10-CM | POA: Diagnosis not present

## 2020-02-28 DIAGNOSIS — K5730 Diverticulosis of large intestine without perforation or abscess without bleeding: Secondary | ICD-10-CM | POA: Diagnosis not present

## 2020-02-28 DIAGNOSIS — R109 Unspecified abdominal pain: Secondary | ICD-10-CM | POA: Diagnosis not present

## 2020-02-28 DIAGNOSIS — Z8711 Personal history of peptic ulcer disease: Secondary | ICD-10-CM | POA: Diagnosis not present

## 2020-02-28 DIAGNOSIS — R52 Pain, unspecified: Secondary | ICD-10-CM | POA: Diagnosis not present

## 2020-02-28 DIAGNOSIS — Z79899 Other long term (current) drug therapy: Secondary | ICD-10-CM | POA: Diagnosis not present

## 2020-02-28 DIAGNOSIS — R1084 Generalized abdominal pain: Secondary | ICD-10-CM | POA: Diagnosis not present

## 2020-02-29 DIAGNOSIS — R52 Pain, unspecified: Secondary | ICD-10-CM | POA: Diagnosis not present

## 2020-02-29 DIAGNOSIS — R1084 Generalized abdominal pain: Secondary | ICD-10-CM | POA: Diagnosis not present

## 2020-02-29 DIAGNOSIS — I10 Essential (primary) hypertension: Secondary | ICD-10-CM | POA: Diagnosis not present

## 2020-02-29 DIAGNOSIS — R319 Hematuria, unspecified: Secondary | ICD-10-CM | POA: Diagnosis not present

## 2020-02-29 DIAGNOSIS — I2510 Atherosclerotic heart disease of native coronary artery without angina pectoris: Secondary | ICD-10-CM | POA: Diagnosis not present

## 2020-02-29 DIAGNOSIS — Z743 Need for continuous supervision: Secondary | ICD-10-CM | POA: Diagnosis not present

## 2020-02-29 DIAGNOSIS — R7303 Prediabetes: Secondary | ICD-10-CM | POA: Diagnosis not present

## 2020-02-29 DIAGNOSIS — R279 Unspecified lack of coordination: Secondary | ICD-10-CM | POA: Diagnosis not present

## 2020-04-07 DIAGNOSIS — K120 Recurrent oral aphthae: Secondary | ICD-10-CM | POA: Diagnosis not present

## 2020-04-07 DIAGNOSIS — L988 Other specified disorders of the skin and subcutaneous tissue: Secondary | ICD-10-CM | POA: Diagnosis not present

## 2020-04-07 DIAGNOSIS — Z743 Need for continuous supervision: Secondary | ICD-10-CM | POA: Diagnosis not present

## 2020-04-10 DIAGNOSIS — Z743 Need for continuous supervision: Secondary | ICD-10-CM | POA: Diagnosis not present

## 2020-04-10 DIAGNOSIS — R52 Pain, unspecified: Secondary | ICD-10-CM | POA: Diagnosis not present

## 2020-04-10 DIAGNOSIS — R279 Unspecified lack of coordination: Secondary | ICD-10-CM | POA: Diagnosis not present

## 2020-04-10 DIAGNOSIS — R58 Hemorrhage, not elsewhere classified: Secondary | ICD-10-CM | POA: Diagnosis not present

## 2020-04-10 DIAGNOSIS — R040 Epistaxis: Secondary | ICD-10-CM | POA: Diagnosis not present

## 2020-04-10 DIAGNOSIS — K1379 Other lesions of oral mucosa: Secondary | ICD-10-CM | POA: Diagnosis not present

## 2020-05-12 DIAGNOSIS — R2681 Unsteadiness on feet: Secondary | ICD-10-CM | POA: Diagnosis not present

## 2020-05-12 DIAGNOSIS — R059 Cough, unspecified: Secondary | ICD-10-CM | POA: Diagnosis not present

## 2020-05-12 DIAGNOSIS — R531 Weakness: Secondary | ICD-10-CM | POA: Diagnosis not present

## 2020-05-12 DIAGNOSIS — J029 Acute pharyngitis, unspecified: Secondary | ICD-10-CM | POA: Diagnosis not present

## 2020-05-12 DIAGNOSIS — K219 Gastro-esophageal reflux disease without esophagitis: Secondary | ICD-10-CM | POA: Diagnosis not present

## 2020-05-12 DIAGNOSIS — Z79899 Other long term (current) drug therapy: Secondary | ICD-10-CM | POA: Diagnosis not present

## 2020-05-12 DIAGNOSIS — E162 Hypoglycemia, unspecified: Secondary | ICD-10-CM | POA: Diagnosis not present

## 2020-05-12 DIAGNOSIS — R0789 Other chest pain: Secondary | ICD-10-CM | POA: Diagnosis not present

## 2020-05-12 DIAGNOSIS — I10 Essential (primary) hypertension: Secondary | ICD-10-CM | POA: Diagnosis not present

## 2020-05-12 DIAGNOSIS — R079 Chest pain, unspecified: Secondary | ICD-10-CM | POA: Diagnosis not present

## 2020-05-12 DIAGNOSIS — R072 Precordial pain: Secondary | ICD-10-CM | POA: Diagnosis not present

## 2020-05-12 DIAGNOSIS — Z743 Need for continuous supervision: Secondary | ICD-10-CM | POA: Diagnosis not present

## 2020-05-12 DIAGNOSIS — I252 Old myocardial infarction: Secondary | ICD-10-CM | POA: Diagnosis not present

## 2020-05-12 DIAGNOSIS — J9811 Atelectasis: Secondary | ICD-10-CM | POA: Diagnosis not present

## 2020-05-12 DIAGNOSIS — E161 Other hypoglycemia: Secondary | ICD-10-CM | POA: Diagnosis not present

## 2020-05-12 DIAGNOSIS — Z951 Presence of aortocoronary bypass graft: Secondary | ICD-10-CM | POA: Diagnosis not present

## 2020-05-21 DIAGNOSIS — R059 Cough, unspecified: Secondary | ICD-10-CM | POA: Diagnosis not present

## 2020-05-21 DIAGNOSIS — G8929 Other chronic pain: Secondary | ICD-10-CM | POA: Diagnosis not present

## 2020-05-21 DIAGNOSIS — Z743 Need for continuous supervision: Secondary | ICD-10-CM | POA: Diagnosis not present

## 2020-05-21 DIAGNOSIS — M545 Low back pain, unspecified: Secondary | ICD-10-CM | POA: Diagnosis not present

## 2020-05-21 DIAGNOSIS — R531 Weakness: Secondary | ICD-10-CM | POA: Diagnosis not present

## 2020-05-22 DIAGNOSIS — Z79891 Long term (current) use of opiate analgesic: Secondary | ICD-10-CM | POA: Diagnosis not present

## 2020-05-22 DIAGNOSIS — G8929 Other chronic pain: Secondary | ICD-10-CM | POA: Diagnosis not present

## 2020-05-22 DIAGNOSIS — Z79899 Other long term (current) drug therapy: Secondary | ICD-10-CM | POA: Diagnosis not present

## 2020-05-22 DIAGNOSIS — M545 Low back pain, unspecified: Secondary | ICD-10-CM | POA: Diagnosis not present

## 2020-05-22 DIAGNOSIS — J029 Acute pharyngitis, unspecified: Secondary | ICD-10-CM | POA: Diagnosis not present

## 2020-05-22 DIAGNOSIS — Z743 Need for continuous supervision: Secondary | ICD-10-CM | POA: Diagnosis not present

## 2020-05-27 DIAGNOSIS — Z79899 Other long term (current) drug therapy: Secondary | ICD-10-CM | POA: Diagnosis not present

## 2020-05-27 DIAGNOSIS — I252 Old myocardial infarction: Secondary | ICD-10-CM | POA: Diagnosis not present

## 2020-05-27 DIAGNOSIS — R531 Weakness: Secondary | ICD-10-CM | POA: Diagnosis not present

## 2020-05-27 DIAGNOSIS — I10 Essential (primary) hypertension: Secondary | ICD-10-CM | POA: Diagnosis not present

## 2020-05-27 DIAGNOSIS — Z743 Need for continuous supervision: Secondary | ICD-10-CM | POA: Diagnosis not present

## 2020-05-27 DIAGNOSIS — Z79891 Long term (current) use of opiate analgesic: Secondary | ICD-10-CM | POA: Diagnosis not present

## 2020-05-27 DIAGNOSIS — Z591 Inadequate housing: Secondary | ICD-10-CM | POA: Diagnosis not present

## 2020-05-27 DIAGNOSIS — G894 Chronic pain syndrome: Secondary | ICD-10-CM | POA: Diagnosis not present

## 2020-05-27 DIAGNOSIS — Z951 Presence of aortocoronary bypass graft: Secondary | ICD-10-CM | POA: Diagnosis not present

## 2020-05-27 DIAGNOSIS — Z599 Problem related to housing and economic circumstances, unspecified: Secondary | ICD-10-CM | POA: Diagnosis not present

## 2020-05-27 DIAGNOSIS — E119 Type 2 diabetes mellitus without complications: Secondary | ICD-10-CM | POA: Diagnosis not present

## 2020-05-27 DIAGNOSIS — M549 Dorsalgia, unspecified: Secondary | ICD-10-CM | POA: Diagnosis not present

## 2020-05-27 DIAGNOSIS — Z7952 Long term (current) use of systemic steroids: Secondary | ICD-10-CM | POA: Diagnosis not present

## 2020-06-23 DIAGNOSIS — M25511 Pain in right shoulder: Secondary | ICD-10-CM | POA: Diagnosis not present

## 2020-06-23 DIAGNOSIS — Z022 Encounter for examination for admission to residential institution: Secondary | ICD-10-CM | POA: Diagnosis not present

## 2020-06-23 DIAGNOSIS — R0789 Other chest pain: Secondary | ICD-10-CM | POA: Diagnosis not present

## 2020-06-23 DIAGNOSIS — M47814 Spondylosis without myelopathy or radiculopathy, thoracic region: Secondary | ICD-10-CM | POA: Diagnosis not present

## 2020-06-23 DIAGNOSIS — I2510 Atherosclerotic heart disease of native coronary artery without angina pectoris: Secondary | ICD-10-CM | POA: Diagnosis not present

## 2020-06-23 DIAGNOSIS — Z20822 Contact with and (suspected) exposure to covid-19: Secondary | ICD-10-CM | POA: Diagnosis not present

## 2020-06-23 DIAGNOSIS — Z79899 Other long term (current) drug therapy: Secondary | ICD-10-CM | POA: Diagnosis not present

## 2020-06-23 DIAGNOSIS — R197 Diarrhea, unspecified: Secondary | ICD-10-CM | POA: Diagnosis not present

## 2020-06-23 DIAGNOSIS — R0609 Other forms of dyspnea: Secondary | ICD-10-CM | POA: Diagnosis not present

## 2020-06-23 DIAGNOSIS — R079 Chest pain, unspecified: Secondary | ICD-10-CM | POA: Diagnosis not present

## 2020-06-23 DIAGNOSIS — I088 Other rheumatic multiple valve diseases: Secondary | ICD-10-CM | POA: Diagnosis not present

## 2020-06-23 DIAGNOSIS — I119 Hypertensive heart disease without heart failure: Secondary | ICD-10-CM | POA: Diagnosis not present

## 2020-06-23 DIAGNOSIS — Z951 Presence of aortocoronary bypass graft: Secondary | ICD-10-CM | POA: Diagnosis not present

## 2020-06-23 DIAGNOSIS — R5381 Other malaise: Secondary | ICD-10-CM | POA: Diagnosis not present

## 2020-06-23 DIAGNOSIS — R001 Bradycardia, unspecified: Secondary | ICD-10-CM | POA: Diagnosis not present

## 2020-06-23 DIAGNOSIS — F32A Depression, unspecified: Secondary | ICD-10-CM | POA: Diagnosis not present

## 2020-06-23 DIAGNOSIS — G8929 Other chronic pain: Secondary | ICD-10-CM | POA: Diagnosis not present

## 2020-06-23 DIAGNOSIS — Z87891 Personal history of nicotine dependence: Secondary | ICD-10-CM | POA: Diagnosis not present

## 2020-06-23 DIAGNOSIS — R0602 Shortness of breath: Secondary | ICD-10-CM | POA: Diagnosis not present

## 2020-06-24 DIAGNOSIS — I25119 Atherosclerotic heart disease of native coronary artery with unspecified angina pectoris: Secondary | ICD-10-CM | POA: Diagnosis not present

## 2020-06-24 DIAGNOSIS — R079 Chest pain, unspecified: Secondary | ICD-10-CM | POA: Diagnosis not present

## 2020-06-24 DIAGNOSIS — I10 Essential (primary) hypertension: Secondary | ICD-10-CM | POA: Diagnosis not present

## 2020-06-24 DIAGNOSIS — R0789 Other chest pain: Secondary | ICD-10-CM | POA: Diagnosis not present

## 2020-06-25 DIAGNOSIS — R0789 Other chest pain: Secondary | ICD-10-CM | POA: Diagnosis not present

## 2020-06-25 DIAGNOSIS — I2510 Atherosclerotic heart disease of native coronary artery without angina pectoris: Secondary | ICD-10-CM | POA: Diagnosis not present

## 2020-06-25 DIAGNOSIS — Z7409 Other reduced mobility: Secondary | ICD-10-CM | POA: Diagnosis not present

## 2020-06-25 DIAGNOSIS — G4733 Obstructive sleep apnea (adult) (pediatric): Secondary | ICD-10-CM | POA: Diagnosis not present

## 2020-06-25 DIAGNOSIS — Z951 Presence of aortocoronary bypass graft: Secondary | ICD-10-CM | POA: Diagnosis not present

## 2020-06-25 DIAGNOSIS — E785 Hyperlipidemia, unspecified: Secondary | ICD-10-CM | POA: Diagnosis not present

## 2020-06-25 DIAGNOSIS — I10 Essential (primary) hypertension: Secondary | ICD-10-CM | POA: Diagnosis not present

## 2020-06-25 DIAGNOSIS — J449 Chronic obstructive pulmonary disease, unspecified: Secondary | ICD-10-CM | POA: Diagnosis not present

## 2020-06-26 DIAGNOSIS — R079 Chest pain, unspecified: Secondary | ICD-10-CM | POA: Diagnosis not present

## 2020-06-26 DIAGNOSIS — R001 Bradycardia, unspecified: Secondary | ICD-10-CM | POA: Diagnosis not present

## 2020-06-26 DIAGNOSIS — Z7409 Other reduced mobility: Secondary | ICD-10-CM | POA: Diagnosis not present

## 2020-06-26 DIAGNOSIS — Z951 Presence of aortocoronary bypass graft: Secondary | ICD-10-CM | POA: Diagnosis not present

## 2020-06-26 DIAGNOSIS — I10 Essential (primary) hypertension: Secondary | ICD-10-CM | POA: Diagnosis not present

## 2020-06-26 DIAGNOSIS — R0789 Other chest pain: Secondary | ICD-10-CM | POA: Diagnosis not present

## 2020-06-27 DIAGNOSIS — Z87891 Personal history of nicotine dependence: Secondary | ICD-10-CM | POA: Diagnosis not present

## 2020-06-27 DIAGNOSIS — K5900 Constipation, unspecified: Secondary | ICD-10-CM | POA: Diagnosis not present

## 2020-06-27 DIAGNOSIS — G8929 Other chronic pain: Secondary | ICD-10-CM | POA: Diagnosis not present

## 2020-06-27 DIAGNOSIS — R531 Weakness: Secondary | ICD-10-CM | POA: Diagnosis not present

## 2020-06-27 DIAGNOSIS — I088 Other rheumatic multiple valve diseases: Secondary | ICD-10-CM | POA: Diagnosis not present

## 2020-06-27 DIAGNOSIS — Z79899 Other long term (current) drug therapy: Secondary | ICD-10-CM | POA: Diagnosis not present

## 2020-06-27 DIAGNOSIS — I739 Peripheral vascular disease, unspecified: Secondary | ICD-10-CM | POA: Diagnosis not present

## 2020-06-27 DIAGNOSIS — M47814 Spondylosis without myelopathy or radiculopathy, thoracic region: Secondary | ICD-10-CM | POA: Diagnosis not present

## 2020-06-27 DIAGNOSIS — Z20822 Contact with and (suspected) exposure to covid-19: Secondary | ICD-10-CM | POA: Diagnosis not present

## 2020-06-27 DIAGNOSIS — K219 Gastro-esophageal reflux disease without esophagitis: Secondary | ICD-10-CM | POA: Diagnosis not present

## 2020-06-27 DIAGNOSIS — R079 Chest pain, unspecified: Secondary | ICD-10-CM | POA: Diagnosis not present

## 2020-06-27 DIAGNOSIS — R0789 Other chest pain: Secondary | ICD-10-CM | POA: Diagnosis not present

## 2020-06-27 DIAGNOSIS — M6281 Muscle weakness (generalized): Secondary | ICD-10-CM | POA: Diagnosis not present

## 2020-06-27 DIAGNOSIS — M25511 Pain in right shoulder: Secondary | ICD-10-CM | POA: Diagnosis not present

## 2020-06-27 DIAGNOSIS — I5033 Acute on chronic diastolic (congestive) heart failure: Secondary | ICD-10-CM | POA: Diagnosis not present

## 2020-06-27 DIAGNOSIS — I2510 Atherosclerotic heart disease of native coronary artery without angina pectoris: Secondary | ICD-10-CM | POA: Diagnosis not present

## 2020-06-27 DIAGNOSIS — R262 Difficulty in walking, not elsewhere classified: Secondary | ICD-10-CM | POA: Diagnosis not present

## 2020-06-27 DIAGNOSIS — R278 Other lack of coordination: Secondary | ICD-10-CM | POA: Diagnosis not present

## 2020-06-27 DIAGNOSIS — M810 Age-related osteoporosis without current pathological fracture: Secondary | ICD-10-CM | POA: Diagnosis not present

## 2020-06-27 DIAGNOSIS — Z951 Presence of aortocoronary bypass graft: Secondary | ICD-10-CM | POA: Diagnosis not present

## 2020-06-27 DIAGNOSIS — R001 Bradycardia, unspecified: Secondary | ICD-10-CM | POA: Diagnosis not present

## 2020-06-27 DIAGNOSIS — M2550 Pain in unspecified joint: Secondary | ICD-10-CM | POA: Diagnosis not present

## 2020-06-27 DIAGNOSIS — R0609 Other forms of dyspnea: Secondary | ICD-10-CM | POA: Diagnosis not present

## 2020-06-27 DIAGNOSIS — Z022 Encounter for examination for admission to residential institution: Secondary | ICD-10-CM | POA: Diagnosis not present

## 2020-06-27 DIAGNOSIS — Z743 Need for continuous supervision: Secondary | ICD-10-CM | POA: Diagnosis not present

## 2020-06-27 DIAGNOSIS — R5381 Other malaise: Secondary | ICD-10-CM | POA: Diagnosis not present

## 2020-06-27 DIAGNOSIS — G4733 Obstructive sleep apnea (adult) (pediatric): Secondary | ICD-10-CM | POA: Diagnosis not present

## 2020-06-27 DIAGNOSIS — F32A Depression, unspecified: Secondary | ICD-10-CM | POA: Diagnosis not present

## 2020-06-27 DIAGNOSIS — I119 Hypertensive heart disease without heart failure: Secondary | ICD-10-CM | POA: Diagnosis not present

## 2020-06-27 DIAGNOSIS — I10 Essential (primary) hypertension: Secondary | ICD-10-CM | POA: Diagnosis not present

## 2020-06-27 DIAGNOSIS — R2689 Other abnormalities of gait and mobility: Secondary | ICD-10-CM | POA: Diagnosis not present

## 2020-06-27 DIAGNOSIS — E785 Hyperlipidemia, unspecified: Secondary | ICD-10-CM | POA: Diagnosis not present

## 2020-07-03 DIAGNOSIS — R262 Difficulty in walking, not elsewhere classified: Secondary | ICD-10-CM | POA: Diagnosis not present

## 2020-07-03 DIAGNOSIS — R5381 Other malaise: Secondary | ICD-10-CM | POA: Diagnosis not present

## 2020-07-03 DIAGNOSIS — M2550 Pain in unspecified joint: Secondary | ICD-10-CM | POA: Diagnosis not present

## 2020-07-03 DIAGNOSIS — K5900 Constipation, unspecified: Secondary | ICD-10-CM | POA: Diagnosis not present

## 2020-07-03 DIAGNOSIS — K219 Gastro-esophageal reflux disease without esophagitis: Secondary | ICD-10-CM | POA: Diagnosis not present

## 2020-07-03 DIAGNOSIS — I5033 Acute on chronic diastolic (congestive) heart failure: Secondary | ICD-10-CM | POA: Diagnosis not present

## 2020-07-03 DIAGNOSIS — I2510 Atherosclerotic heart disease of native coronary artery without angina pectoris: Secondary | ICD-10-CM | POA: Diagnosis not present

## 2020-07-03 DIAGNOSIS — I739 Peripheral vascular disease, unspecified: Secondary | ICD-10-CM | POA: Diagnosis not present

## 2020-07-03 DIAGNOSIS — M810 Age-related osteoporosis without current pathological fracture: Secondary | ICD-10-CM | POA: Diagnosis not present

## 2020-07-25 DIAGNOSIS — R262 Difficulty in walking, not elsewhere classified: Secondary | ICD-10-CM | POA: Diagnosis not present

## 2020-07-25 DIAGNOSIS — I5033 Acute on chronic diastolic (congestive) heart failure: Secondary | ICD-10-CM | POA: Diagnosis not present

## 2020-07-28 DIAGNOSIS — L03114 Cellulitis of left upper limb: Secondary | ICD-10-CM | POA: Diagnosis not present

## 2020-07-28 DIAGNOSIS — L03113 Cellulitis of right upper limb: Secondary | ICD-10-CM | POA: Diagnosis not present

## 2020-07-28 DIAGNOSIS — R21 Rash and other nonspecific skin eruption: Secondary | ICD-10-CM | POA: Diagnosis not present

## 2020-07-28 DIAGNOSIS — L298 Other pruritus: Secondary | ICD-10-CM | POA: Diagnosis not present

## 2020-08-02 DIAGNOSIS — Z1159 Encounter for screening for other viral diseases: Secondary | ICD-10-CM | POA: Diagnosis not present

## 2020-08-09 DIAGNOSIS — E559 Vitamin D deficiency, unspecified: Secondary | ICD-10-CM | POA: Diagnosis not present

## 2020-08-09 DIAGNOSIS — E7800 Pure hypercholesterolemia, unspecified: Secondary | ICD-10-CM | POA: Diagnosis not present

## 2020-08-09 DIAGNOSIS — Z0000 Encounter for general adult medical examination without abnormal findings: Secondary | ICD-10-CM | POA: Diagnosis not present

## 2020-08-09 DIAGNOSIS — R5383 Other fatigue: Secondary | ICD-10-CM | POA: Diagnosis not present

## 2020-08-09 DIAGNOSIS — M129 Arthropathy, unspecified: Secondary | ICD-10-CM | POA: Diagnosis not present

## 2020-08-09 DIAGNOSIS — L259 Unspecified contact dermatitis, unspecified cause: Secondary | ICD-10-CM | POA: Diagnosis not present

## 2020-08-09 DIAGNOSIS — Z79899 Other long term (current) drug therapy: Secondary | ICD-10-CM | POA: Diagnosis not present

## 2020-08-16 DIAGNOSIS — Z1159 Encounter for screening for other viral diseases: Secondary | ICD-10-CM | POA: Diagnosis not present

## 2020-08-21 DIAGNOSIS — H548 Legal blindness, as defined in USA: Secondary | ICD-10-CM | POA: Diagnosis not present

## 2020-08-21 DIAGNOSIS — R69 Illness, unspecified: Secondary | ICD-10-CM | POA: Diagnosis not present

## 2020-08-21 DIAGNOSIS — B86 Scabies: Secondary | ICD-10-CM | POA: Diagnosis not present

## 2020-08-23 DIAGNOSIS — Z1159 Encounter for screening for other viral diseases: Secondary | ICD-10-CM | POA: Diagnosis not present

## 2022-03-04 DIAGNOSIS — R7881 Bacteremia: Secondary | ICD-10-CM | POA: Insufficient documentation

## 2022-04-02 DIAGNOSIS — A419 Sepsis, unspecified organism: Secondary | ICD-10-CM | POA: Insufficient documentation

## 2022-04-10 DIAGNOSIS — M48061 Spinal stenosis, lumbar region without neurogenic claudication: Secondary | ICD-10-CM | POA: Insufficient documentation

## 2022-04-10 DIAGNOSIS — K219 Gastro-esophageal reflux disease without esophagitis: Secondary | ICD-10-CM | POA: Insufficient documentation

## 2022-06-28 DIAGNOSIS — N401 Enlarged prostate with lower urinary tract symptoms: Secondary | ICD-10-CM | POA: Insufficient documentation

## 2022-09-29 DIAGNOSIS — N3000 Acute cystitis without hematuria: Secondary | ICD-10-CM | POA: Insufficient documentation

## 2023-01-23 ENCOUNTER — Ambulatory Visit: Payer: Medicare Other | Admitting: Urgent Care

## 2023-01-23 DIAGNOSIS — I10 Essential (primary) hypertension: Secondary | ICD-10-CM | POA: Insufficient documentation

## 2023-01-23 DIAGNOSIS — N390 Urinary tract infection, site not specified: Secondary | ICD-10-CM | POA: Insufficient documentation

## 2023-01-23 DIAGNOSIS — N179 Acute kidney failure, unspecified: Secondary | ICD-10-CM | POA: Insufficient documentation

## 2023-01-24 ENCOUNTER — Ambulatory Visit (INDEPENDENT_AMBULATORY_CARE_PROVIDER_SITE_OTHER): Payer: Self-pay | Admitting: Urgent Care

## 2023-01-24 ENCOUNTER — Other Ambulatory Visit: Payer: Self-pay | Admitting: Urgent Care

## 2023-01-24 ENCOUNTER — Encounter: Payer: Self-pay | Admitting: Urgent Care

## 2023-01-24 VITALS — BP 145/90 | HR 97 | Ht 63.0 in | Wt 177.0 lb

## 2023-01-24 DIAGNOSIS — N179 Acute kidney failure, unspecified: Secondary | ICD-10-CM

## 2023-01-24 DIAGNOSIS — Z87442 Personal history of urinary calculi: Secondary | ICD-10-CM

## 2023-01-24 DIAGNOSIS — N390 Urinary tract infection, site not specified: Secondary | ICD-10-CM

## 2023-01-24 DIAGNOSIS — R5383 Other fatigue: Secondary | ICD-10-CM | POA: Insufficient documentation

## 2023-01-24 DIAGNOSIS — D649 Anemia, unspecified: Secondary | ICD-10-CM | POA: Insufficient documentation

## 2023-01-24 DIAGNOSIS — G8929 Other chronic pain: Secondary | ICD-10-CM | POA: Insufficient documentation

## 2023-01-24 DIAGNOSIS — N401 Enlarged prostate with lower urinary tract symptoms: Secondary | ICD-10-CM

## 2023-01-24 DIAGNOSIS — N184 Chronic kidney disease, stage 4 (severe): Secondary | ICD-10-CM

## 2023-01-24 DIAGNOSIS — R1013 Epigastric pain: Secondary | ICD-10-CM

## 2023-01-24 DIAGNOSIS — E785 Hyperlipidemia, unspecified: Secondary | ICD-10-CM

## 2023-01-24 DIAGNOSIS — G894 Chronic pain syndrome: Secondary | ICD-10-CM

## 2023-01-24 DIAGNOSIS — M25512 Pain in left shoulder: Secondary | ICD-10-CM

## 2023-01-24 DIAGNOSIS — Z951 Presence of aortocoronary bypass graft: Secondary | ICD-10-CM

## 2023-01-24 DIAGNOSIS — I2510 Atherosclerotic heart disease of native coronary artery without angina pectoris: Secondary | ICD-10-CM

## 2023-01-24 DIAGNOSIS — Z8711 Personal history of peptic ulcer disease: Secondary | ICD-10-CM

## 2023-01-24 DIAGNOSIS — M5136 Other intervertebral disc degeneration, lumbar region with discogenic back pain only: Secondary | ICD-10-CM | POA: Insufficient documentation

## 2023-01-24 DIAGNOSIS — R350 Frequency of micturition: Secondary | ICD-10-CM

## 2023-01-24 DIAGNOSIS — I10 Essential (primary) hypertension: Secondary | ICD-10-CM

## 2023-01-24 DIAGNOSIS — R296 Repeated falls: Secondary | ICD-10-CM | POA: Insufficient documentation

## 2023-01-24 LAB — POCT URINALYSIS DIPSTICK
Bilirubin, UA: NEGATIVE
Blood, UA: POSITIVE
Glucose, UA: NEGATIVE
Ketones, UA: NEGATIVE
Nitrite, UA: NEGATIVE
Protein, UA: POSITIVE — AB
Spec Grav, UA: 1.025 (ref 1.010–1.025)
Urobilinogen, UA: 0.2 U/dL
pH, UA: 6 (ref 5.0–8.0)

## 2023-01-24 MED ORDER — METOPROLOL SUCCINATE ER 25 MG PO TB24: 25.0000 mg | ORAL_TABLET | Freq: Every day | ORAL | 0 refills | Status: AC

## 2023-01-24 MED ORDER — SULFAMETHOXAZOLE-TRIMETHOPRIM 400-80 MG PO TABS: 1.0000 | ORAL_TABLET | Freq: Two times a day (BID) | ORAL | 0 refills | Status: DC

## 2023-01-24 MED ORDER — PANTOPRAZOLE SODIUM 40 MG PO TBEC: 40.0000 mg | DELAYED_RELEASE_TABLET | Freq: Every day | ORAL | 0 refills | Status: AC

## 2023-01-24 MED ORDER — AMLODIPINE BESYLATE 10 MG PO TABS: 10.0000 mg | ORAL_TABLET | Freq: Every day | ORAL | 0 refills | Status: AC

## 2023-01-24 MED ORDER — TAMSULOSIN HCL 0.4 MG PO CAPS: 0.4000 mg | ORAL_CAPSULE | Freq: Every day | ORAL | 0 refills | Status: AC

## 2023-01-24 MED ORDER — GABAPENTIN 300 MG PO CAPS: 300.0000 mg | ORAL_CAPSULE | Freq: Two times a day (BID) | ORAL | 3 refills | Status: DC

## 2023-01-24 NOTE — Patient Instructions (Addendum)
You had labs draw today to assess your current symptoms. Please fill your medications and start taking as prescribed.  The most important to initiate today is Bactrim (sulfamethoxazole-trimethoprim). This is an antibiotic. Take it twice daily for the next month.  Please return to our office for a recheck in 2-3 weeks.

## 2023-01-24 NOTE — Assessment & Plan Note (Signed)
Hypertension Prescribed amlodipine and metoprolol, however ran out a while back, and only just recently filled norvasc only. -Continue amlodipine and metoprolol, refills called in today.

## 2023-01-24 NOTE — Assessment & Plan Note (Signed)
Will review records to determine what workup has been performed to date.  -must avoid NSAIDs -allergy documented to norco with unspecified reaction (01/24/2020) -refill gabapentin, consider repeat imaging and possible referral to ortho

## 2023-01-24 NOTE — Assessment & Plan Note (Signed)
Low energy and documented anemia. -Order blood work to assess hemoglobin levels and other potential causes of fatigue.

## 2023-01-24 NOTE — Assessment & Plan Note (Addendum)
Recurrent Urinary Tract Infections Likely due to inadequate hydration and possible prostatitis. Recent hospitalization for sepsis secondary to UTI. Current urine sample suggestive of ongoing infection. -Start single-strength Bactrim for 1 month to treat possible prostatitis and prevent recurrent UTIs. Creatinine clearance estimated to be 35 therefore okay to start -Encourage increased water intake, aiming for 8 ounces every 2-3 hours. -Obtain urine culture to confirm infection and guide antibiotic therapy. (Prior urine cx resistant to many abx)

## 2023-01-24 NOTE — Assessment & Plan Note (Signed)
History of scoliosis and recent fall with exacerbation of chronic back pain. Possible muscle spasms contributing to pain. -Restart gabapentin 300mg  twice daily for neuropathic pain; pt has been out for "some time". -Consider topical treatments for muscle spasms. Pt previously on baclofen, however should be avoided based on [place]ers criteria -pt requesting hydrocodone - was prescribed by former PCP several years prior. This is listed as a current allergy. Will need further clarification on this.

## 2023-01-24 NOTE — Progress Notes (Signed)
New Patient Office Visit  Subjective:  Patient ID: Michael Donaldson, male    DOB: [birth year]  Age: 88 y.o. MRN: [id number]  CC:  Chief Complaint  Patient presents with   Establish Care    New pt est care.   Discussed the use of AI software for clinical note transcription with the patient who gave verbal consent to proceed.  HPI Michael Donaldson presents to establish care. The majority of the information was obtained from thorough chart review and face to face discussion, although pt unable to provide detailed health information.  88yo male presents to establish care. He just recently moved to the area to live with a friend, was previously in [facility]; his wife is currently in skilled nursing in [place].   He has a known PMH positive for CAD s/p CABG, HTN, "prostate issues", HTN, CKD, anemia, recurrent UTIs and chronic pain primarily from his lower back and L shoulder.  He presents today for hospital follow up. Was admitted from 12/10-12/15 due to UTI and AKI at that time. He presented to the ER following a fall at his son's house. The fall resulted in a hospital admission, during which acute kidney injury was diagnosed, attributed to urinary infection and inadequate hydration. He was given 5 days of IV abx, and discharged on fosfomycin, but unfortunately did not fill this script due to cost. He was unable to afford it. He denies any symptoms of dysuria, odor, hematuria, fever or confusion, but does admit to constantly feeling weak. Pt does have 6 ER / hospital visits in the past 9 months for sx of the same. He also reports a known hx of "prostate issues" but is unclear about his former dx. Was prescribed tamsulosin "a while back", prior CT scans showing nodularity.   The patient reported a long-standing back problem, diagnosed as scoliosis several years prior. The recent fall exacerbated the patient's back pain, which has not fully resolved. He was prescribed hydrocodone years  ago by PCP, but now is only prescribed gabapentin 300mg  BID. Pt states he ran out of this several weeks ago and is requesting refill. Denies dizziness or drowsiness on this medication, tolerating well. He also reports chronic L shoulder pain, states this was "evaluated in the past", however I am unable to find specifics regarding this workup from chart review.  The patient reported a history of recurrent urinary tract infections, leading to sepsis on multiple occasions. The patient admitted to not drinking enough water, consuming approximately one 16-ounce bottle per day, supplemented with occasional soft drinks. The patient acknowledged the need to increase his water intake. Has longstanding hx of imaging studies showing L nephrolithiasis, but no obstruction. Pt denies L flank pain.  The patient reported a lack of energy, which has been an intermittent issue for an extended period. The patient also reported difficulty sleeping. He does have a known hx of sleep apnea, saw sleep medicine in [place] in 2023. Was prescribed CPAP however pt states he does not use it.  The patient has a history of open heart surgery, but denied any current chest pain. Previously prescribed nitroglycerin, but denies having had to use it in "many years."   The patient reported a history of smoking, but not regularly. The patient also reported a history of shoulder pain, which has been worsening over the past year. The patient has a documented history of anemia.  The patient is currently staying with a friend, as he is not allowed to live  alone in his own home due to safety concerns raised by the Department of Social Services. Was previously in assisted living, however pt reports where he is now seems more adequate. Denies any safety issues. Has a home keeper that helps take care of him and assist with his transportation needs. Pt is currently residing in [place], [place].   The patient previously worked for a [employer] when working.   Outpatient Encounter Medications as of 01/24/2023  Medication Sig   HYDROcodone-acetaminophen (NORCO) 7.5-325 MG tablet Take 1 tablet by mouth every 6 (six) hours as needed for moderate pain (pain score 4-6).   latanoprost (XALATAN) 0.005 % ophthalmic solution Place 1 drop into the right eye at bedtime.   sulfamethoxazole-trimethoprim (BACTRIM) 400-80 MG tablet Take 1 tablet by mouth 2 (two) times daily.   [DISCONTINUED] amLODipine (NORVASC) 10 MG tablet Take 10 mg by mouth daily.   [DISCONTINUED] gabapentin (NEURONTIN) 300 MG capsule Take 300 mg by mouth 2 (two) times daily.   [DISCONTINUED] metoprolol succinate (TOPROL-XL) 25 MG 24 hr tablet Take 25 mg by mouth daily.   [DISCONTINUED] nitroGLYCERIN (NITROSTAT) 0.4 MG SL tablet Place 1 tablet (0.4 mg total) under the tongue every 5 (five) minutes as needed for chest pain. YOU ARE OVERDUE FOR F/U. MUST CALL [phone] TO CONT REFILLS. 1st attempt   [DISCONTINUED] pantoprazole (PROTONIX) 40 MG tablet Take 40 mg by mouth daily.   [DISCONTINUED] tamsulosin (FLOMAX) 0.4 MG CAPS capsule Take 0.4 mg by mouth daily after breakfast.   amLODipine (NORVASC) 10 MG tablet Take 1 tablet (10 mg total) by mouth daily.   gabapentin (NEURONTIN) 300 MG capsule Take 1 capsule (300 mg total) by mouth 2 (two) times daily.   metoprolol succinate (TOPROL-XL) 25 MG 24 hr tablet Take 1 tablet (25 mg total) by mouth daily.   pantoprazole (PROTONIX) 40 MG tablet Take 1 tablet (40 mg total) by mouth daily.   tamsulosin (FLOMAX) 0.4 MG CAPS capsule Take 1 capsule (0.4 mg total) by mouth daily after breakfast.   [DISCONTINUED] ALPRAZolam (XANAX) 0.25 MG tablet Take 0.25 mg by mouth 3 (three) times daily as needed.   [DISCONTINUED] aspirin 81 MG tablet Take 81 mg by mouth daily. Taking 4 daily   [DISCONTINUED] brimonidine (ALPHAGAN) 0.15 % ophthalmic solution Place 1 drop into both eyes 2 (two) times daily.    [DISCONTINUED] Calcium  Carbonate-Vitamin D (CALCIUM + D PO) Take 2 tablets by mouth daily.     [DISCONTINUED] docusate sodium (COLACE) 100 MG capsule Take 100 mg by mouth daily.   [DISCONTINUED] Flaxseed, Linseed, (FLAX SEEDS PO) Take by mouth daily. Pt unsure of dosage   [DISCONTINUED] hydrOXYzine (VISTARIL) 50 MG capsule Take 50 mg by mouth 3 (three) times daily as needed for itching.   [DISCONTINUED] metoprolol tartrate (LOPRESSOR) 25 MG tablet TAKE 1/2 TABLET BY MOUTH TWICE DAILY   [DISCONTINUED] Multiple Vitamin (MULTIVITAMIN) capsule Take 1 capsule by mouth daily.   [DISCONTINUED] Omega-3 Fatty Acids (OMEGA 3 PO) Take by mouth daily. Pt unsure of dosage   [DISCONTINUED] omeprazole (PRILOSEC) 20 MG capsule Take 20 mg by mouth daily.   [DISCONTINUED] oxyCODONE-acetaminophen (PERCOCET) 10-325 MG per tablet Take 1 tablet by mouth every 4 (four) hours as needed.    [DISCONTINUED] Pomegranate, Punica granatum, (POMEGRANATE PO) Take by mouth daily. Pt unsure of dosage   [DISCONTINUED] Sildenafil Citrate (VIAGRA PO) Take by mouth. As directed. Pt unsure of dosage   [DISCONTINUED] simvastatin (ZOCOR) 20 MG tablet Take 20 mg by mouth  daily.   [DISCONTINUED] zolpidem (AMBIEN) 10 MG tablet Take 10 mg by mouth at bedtime as needed.   No facility-administered encounter medications on file as of 01/24/2023.    Past Medical History:  Diagnosis Date   Anxiety    Arthritis    BPH (benign prostatic hyperplasia)    Coronary artery disease    cabg x 4 in 2009   Dyslipidemia    GERD (gastroesophageal reflux disease)    Hypertension    Kidney stones    Long term prescription opiate use    Obstructive sleep apnea    Scoliosis    Shakiness    Urinary tract infection     Past Surgical History:  Procedure Laterality Date   APPENDECTOMY     CARDIAC CATHETERIZATION  09/30/2007   cabg x 4   CORONARY ARTERY BYPASS GRAFT  10/02/2007   x 4   OTHER SURGICAL HISTORY     tonsilectomy    Family History  Problem Relation Age of  Onset   Cancer Mother        colon   Heart attack Father    Heart attack Brother     Social History   Socioeconomic History   Marital status: Married    Spouse name: Not on file   Number of children: 1   Years of education: Not on file   Highest education level: Not on file  Occupational History   Not on file  Tobacco Use   Smoking status: Former    Types: Cigarettes   Smokeless tobacco: Never  Vaping Use   Vaping status: Never Used  Substance and Sexual Activity   Alcohol use: Yes   Drug use: No   Sexual activity: Not Currently    Partners: Female  Other Topics Concern   Not on file  Social History Narrative   Not on file   Social Drivers of Health   Financial Resource Strain: Low Risk  (06/12/2022)   Received from [facility]   Overall [employer] Strain (CARDIA)    Difficulty of Paying Living Expenses: Not hard at all  Food Insecurity: No Food Insecurity (06/12/2022)   Received from [facility] Vital Sign    Worried About Running Out of Food in the Last Year: Never true    Ran Out of Food in the Last Year: Never true  Transportation Needs: No Transportation Needs (06/12/2022)   Received from [facility] - Transportation    Lack of Transportation (Medical): No    Lack of Transportation (Non-Medical): No  Physical Activity: Not on file  Stress: Not on file  Social Connections: Not on file  Intimate Partner Violence: Not At Risk (06/12/2022)   Received from [facility], Afraid, Rape, and Kick questionnaire    Fear of Current or Ex-Partner: No    Emotionally Abused: No    Physically Abused: No    Sexually Abused: No    ROS: as noted in HPI  Objective:  BP (!) 145/90   Pulse 97   Ht 5\' 3"  (1.6 m)   Wt 177 lb (80.3 kg)   SpO2 97%   BMI 31.35 kg/m   Physical Exam Vitals and nursing note reviewed.  Constitutional:      General: He is not in  acute distress.    Appearance: Normal appearance. He is obese. He is not ill-appearing, toxic-appearing or diaphoretic.  HENT:  Head: Normocephalic. Contusion present.      Right Ear: No swelling or tenderness. No middle ear effusion. Tympanic membrane is not injected, perforated, erythematous or bulging.     Left Ear: No swelling or tenderness.  No middle ear effusion. Tympanic membrane is not injected, perforated, erythematous or bulging.     Nose: Nose normal.     Mouth/Throat:     Mouth: Mucous membranes are moist.     Pharynx: Oropharynx is clear. No oropharyngeal exudate or posterior oropharyngeal erythema.     Comments: Prior tonsillectomy Eyes:     General: No scleral icterus.       Right eye: No discharge.        Left eye: No discharge.     Extraocular Movements: Extraocular movements intact.     Pupils: Pupils are equal, round, and reactive to light.  Cardiovascular:     Rate and Rhythm: Normal rate and regular rhythm.     Heart sounds: Normal heart sounds. No murmur heard. Pulmonary:     Effort: Pulmonary effort is normal. No respiratory distress.     Breath sounds: Normal breath sounds. No stridor. No wheezing or rhonchi.  Musculoskeletal:     Cervical back: No tenderness.  Lymphadenopathy:     Cervical: No cervical adenopathy.  Skin:    General: Skin is warm and dry.  Neurological:     Mental Status: He is alert.     Gait: Gait abnormal (kyphotic posture, slow shuffling gait, leaning forward using rollator).  Psychiatric:        Mood and Affect: Mood normal.        Behavior: Behavior normal.     Last CBC Lab Results  Component Value Date   WBC 10.4 10/06/2007   HGB 10.1 (L) 10/06/2007   HCT 29.9 (L) 10/06/2007   MCV 93.4 10/06/2007   RDW 14.0 10/06/2007   PLT 205 DELTA CHECK NOTED 10/06/2007   Last metabolic panel Lab Results  Component Value Date   GLUCOSE 102 (H) 10/05/2007   NA 136 10/05/2007   K 4.3 HEMOLYZED SPECIMEN, RESULTS MAY BE AFFECTED  10/05/2007   CL 104 10/05/2007   CO2 27 10/05/2007   BUN 17 10/05/2007   CREATININE 1.06 10/05/2007   CALCIUM 8.0 (L) 10/05/2007   PROT 7.2 09/29/2007   ALBUMIN 4.3 09/29/2007   BILITOT 0.9 09/29/2007   ALKPHOS 76 09/29/2007   AST 17 09/29/2007   ALT 15 09/29/2007   Last lipids Lab Results  Component Value Date   CHOL  09/30/2007    139        ATP III CLASSIFICATION:  <200     mg/dL   Desirable  782-956  mg/dL   Borderline High  >=213    mg/dL   High   HDL 40 08/65/7846   LDLCALC  09/30/2007    75        Total Cholesterol/HDL:CHD Risk Coronary Heart Disease Risk Table                     Men   Women  1/2 Average Risk   3.4   3.3   TRIG 122 09/30/2007   CHOLHDL 3.5 09/30/2007   Last hemoglobin A1c Lab Results  Component Value Date   HGBA1C  10/01/2007    5.9 (NOTE)   The ADA recommends the following therapeutic goal for glycemic   control related to Hgb A1C measurement:   Goal of Therapy:   < 7.0% Hgb A1C  Reference: American Diabetes Association: Clinical Practice   Recommendations 2008, Diabetes Care,  2008, 31:(Suppl 1).   Last thyroid functions No results found for: "TSH", "T3TOTAL", "T4TOTAL", "THYROIDAB" Last vitamin D No results found for: "25OHVITD2", "25OHVITD3", "VD25OH" Last vitamin B12 and Folate No results found for: "VITAMINB12", "FOLATE"    Assessment & Plan:  Recurrent urinary tract infection Assessment & Plan: Recurrent Urinary Tract Infections Likely due to inadequate hydration and possible prostatitis. Recent hospitalization for sepsis secondary to UTI. Current urine sample suggestive of ongoing infection. -Start single-strength Bactrim for 1 month to treat possible prostatitis and prevent recurrent UTIs. Creatinine clearance estimated to be 35 therefore okay to start -Encourage increased water intake, aiming for 8 ounces every 2-3 hours. -Obtain urine culture to confirm infection and guide antibiotic therapy. (Prior urine cx resistant to many  abx)  Orders: -     POCT urinalysis dipstick -     Urine Culture -     Sulfamethoxazole-Trimethoprim; Take 1 tablet by mouth 2 (two) times daily.  Dispense: 60 tablet; Refill: 0  Essential hypertension Assessment & Plan: Hypertension Prescribed amlodipine and metoprolol, however ran out a while back, and only just recently filled norvasc only. -Continue amlodipine and metoprolol, refills called in today.   Orders: -     amLODIPine Besylate; Take 1 tablet (10 mg total) by mouth daily.  Dispense: 90 tablet; Refill: 0 -     Metoprolol Succinate ER; Take 1 tablet (25 mg total) by mouth daily.  Dispense: 90 tablet; Refill: 0 -     Comprehensive metabolic panel -     TSH  Chronic pain syndrome -     Gabapentin; Take 1 capsule (300 mg total) by mouth 2 (two) times daily.  Dispense: 60 capsule; Refill: 3  Chronic kidney disease, stage 4 (severe) (HCC) -     CBC with Differential/Platelet -     Comprehensive metabolic panel  Coronary artery disease involving native coronary artery of native heart without angina pectoris  Degeneration of intervertebral disc of lumbar region with discogenic back pain Assessment & Plan: History of scoliosis and recent fall with exacerbation of chronic back pain. Possible muscle spasms contributing to pain. -Restart gabapentin 300mg  twice daily for neuropathic pain; pt has been out for "some time". -Consider topical treatments for muscle spasms. Pt previously on baclofen, however should be avoided based on Beers criteria -pt requesting hydrocodone - was prescribed by former PCP several years prior. This is listed as a current allergy. Will need further clarification on this.   Personal history of kidney stones  Hx of CABG -     Lipid panel  Recurrent falls -     Urine Culture -     B12 and Folate Panel -     CBC with Differential/Platelet -     Hemoglobin A1c -     Iron, TIBC and Ferritin Panel -     Sedimentation rate -     TSH -     Lipid  panel -     VITAMIN D 25 Hydroxy (Vit-D Deficiency, Fractures)  Personal history of gastric ulcer Assessment & Plan: History of stomach ulcers, likely from former NSAID use. -Continue pantoprazole. Refilled today -Check vitamin B12 levels due to potential deficiencies with long-term pantoprazole use.  Orders: -     Pantoprazole Sodium; Take 1 tablet (40 mg total) by mouth daily.  Dispense: 90 tablet; Refill: 0  Dyslipidemia -     Lipid panel  Other fatigue -  B12 and Folate Panel -     CBC with Differential/Platelet -     Hemoglobin A1c -     Iron, TIBC and Ferritin Panel -     TSH -     Lipid panel -     VITAMIN D 25 Hydroxy (Vit-D Deficiency, Fractures)  Anemia, unspecified type Assessment & Plan: Low energy and documented anemia. -Order blood work to assess hemoglobin levels and other potential causes of fatigue.  Orders: -     CBC with Differential/Platelet -     Iron, TIBC and Ferritin Panel  Benign prostatic hyperplasia with urinary frequency -     Tamsulosin HCl; Take 1 capsule (0.4 mg total) by mouth daily after breakfast.  Dispense: 90 capsule; Refill: 0 -     Sulfamethoxazole-Trimethoprim; Take 1 tablet by mouth 2 (two) times daily.  Dispense: 60 tablet; Refill: 0 -     PSA, total and free  Chronic left shoulder pain Assessment & Plan: Will review records to determine what workup has been performed to date.  -must avoid NSAIDs -allergy documented to norco with unspecified reaction (01/24/2020) -refill gabapentin, consider repeat imaging and possible referral to ortho   Total time spent face to face with patient, chart review, personal interpretation of outside lab results, documentation and coordination of care was 80 minutes.  Return in about 2 weeks (around 02/07/2023).   Maretta Bees, PA

## 2023-01-24 NOTE — Assessment & Plan Note (Addendum)
History of stomach ulcers, likely from former NSAID use. -Continue pantoprazole. Refilled today -Check vitamin B12 levels due to potential deficiencies with long-term pantoprazole use.

## 2023-01-26 LAB — URINE CULTURE
MICRO NUMBER:: 15894424
Result:: NO GROWTH
SPECIMEN QUALITY:: ADEQUATE

## 2023-01-27 LAB — SEDIMENTATION RATE: Sed Rate: 25 mm/h — ABNORMAL HIGH (ref 0–20)

## 2023-01-27 LAB — LIPID PANEL
Cholesterol: 167 mg/dL (ref <200)
HDL: 62 mg/dL (ref >=40)
LDL Cholesterol (Calc): 78 mg/dL
Non-HDL Cholesterol (Calc): 105 mg/dL (ref <130)
Total CHOL/HDL Ratio: 2.7 (calc) (ref <5.0)
Triglycerides: 168 mg/dL — ABNORMAL HIGH (ref <150)

## 2023-01-27 LAB — CBC WITH DIFFERENTIAL/PLATELET
Absolute Lymphocytes: 2645 {cells}/uL (ref 850–3900)
Absolute Monocytes: 774 {cells}/uL (ref 200–950)
Basophils Absolute: 90 {cells}/uL (ref 0–200)
Basophils Relative: 0.7 %
Eosinophils Absolute: 271 {cells}/uL (ref 15–500)
Eosinophils Relative: 2.1 %
HCT: 42.5 % (ref 38.5–50.0)
Hemoglobin: 13.4 g/dL (ref 13.2–17.1)
MCH: 28.9 pg (ref 27.0–33.0)
MCHC: 31.5 g/dL — ABNORMAL LOW (ref 32.0–36.0)
MCV: 91.6 fL (ref 80.0–100.0)
MPV: 10.8 fL (ref 7.5–12.5)
Monocytes Relative: 6 %
Neutro Abs: 9120 {cells}/uL — ABNORMAL HIGH (ref 1500–7800)
Neutrophils Relative %: 70.7 %
Platelets: 417 10*3/uL — ABNORMAL HIGH (ref 140–400)
RBC: 4.64 10*6/uL (ref 4.20–5.80)
RDW: 14.7 % (ref 11.0–15.0)
Total Lymphocyte: 20.5 %
WBC: 12.9 10*3/uL — ABNORMAL HIGH (ref 3.8–10.8)

## 2023-01-27 LAB — PSA, TOTAL AND FREE
PSA, Free: 2.4 ng/mL
PSA, Total: 14.6 ng/mL — ABNORMAL HIGH (ref <=4.0)

## 2023-01-27 LAB — COMPREHENSIVE METABOLIC PANEL
AG Ratio: 1.3 (calc) (ref 1.0–2.5)
ALT: 9 U/L (ref 9–46)
AST: 13 U/L (ref 10–35)
Albumin: 4.3 g/dL (ref 3.6–5.1)
Alkaline phosphatase (APISO): 113 U/L (ref 35–144)
BUN/Creatinine Ratio: 15 (calc) (ref 6–22)
BUN: 20 mg/dL (ref 7–25)
CO2: 23 mmol/L (ref 20–32)
Calcium: 9.6 mg/dL (ref 8.6–10.3)
Chloride: 107 mmol/L (ref 98–110)
Creat: 1.32 mg/dL — ABNORMAL HIGH (ref 0.70–1.22)
Globulin: 3.2 g/dL (ref 1.9–3.7)
Glucose, Bld: 126 mg/dL — ABNORMAL HIGH (ref 65–99)
Potassium: 4.2 mmol/L (ref 3.5–5.3)
Sodium: 141 mmol/L (ref 135–146)
Total Bilirubin: 0.4 mg/dL (ref 0.2–1.2)
Total Protein: 7.5 g/dL (ref 6.1–8.1)

## 2023-01-27 LAB — B12 AND FOLATE PANEL
Folate: 7.7 ng/mL
Vitamin B-12: 374 pg/mL (ref 200–1100)

## 2023-01-27 LAB — TSH: TSH: 1.25 m[IU]/L (ref 0.40–4.50)

## 2023-01-27 LAB — HEMOGLOBIN A1C
Hgb A1c MFr Bld: 6 %{Hb} — ABNORMAL HIGH (ref <5.7)
Mean Plasma Glucose: 126 mg/dL
eAG (mmol/L): 7 mmol/L

## 2023-01-27 LAB — IRON,TIBC AND FERRITIN PANEL
%SAT: 33 % (ref 20–48)
Ferritin: 81 ng/mL (ref 24–380)
Iron: 94 ug/dL (ref 50–180)
TIBC: 281 ug/dL (ref 250–425)

## 2023-01-27 LAB — VITAMIN D 25 HYDROXY (VIT D DEFICIENCY, FRACTURES): Vit D, 25-Hydroxy: 25 ng/mL — ABNORMAL LOW (ref 30–100)

## 2023-02-06 ENCOUNTER — Ambulatory Visit: Payer: Self-pay | Admitting: Urgent Care

## 2023-02-13 ENCOUNTER — Ambulatory Visit: Payer: Self-pay | Admitting: Urgent Care

## 2023-02-20 ENCOUNTER — Ambulatory Visit (INDEPENDENT_AMBULATORY_CARE_PROVIDER_SITE_OTHER): Payer: Self-pay | Admitting: Urgent Care

## 2023-02-20 VITALS — BP 138/72 | HR 69 | Wt 180.0 lb

## 2023-02-20 DIAGNOSIS — M5136 Other intervertebral disc degeneration, lumbar region with discogenic back pain only: Secondary | ICD-10-CM

## 2023-02-20 DIAGNOSIS — N390 Urinary tract infection, site not specified: Secondary | ICD-10-CM

## 2023-02-20 DIAGNOSIS — G894 Chronic pain syndrome: Secondary | ICD-10-CM

## 2023-02-20 DIAGNOSIS — I10 Essential (primary) hypertension: Secondary | ICD-10-CM

## 2023-02-20 DIAGNOSIS — M25512 Pain in left shoulder: Secondary | ICD-10-CM

## 2023-02-20 DIAGNOSIS — R350 Frequency of micturition: Secondary | ICD-10-CM

## 2023-02-20 DIAGNOSIS — K219 Gastro-esophageal reflux disease without esophagitis: Secondary | ICD-10-CM

## 2023-02-20 DIAGNOSIS — N401 Enlarged prostate with lower urinary tract symptoms: Secondary | ICD-10-CM

## 2023-02-20 DIAGNOSIS — G8929 Other chronic pain: Secondary | ICD-10-CM

## 2023-02-20 MED ORDER — ACETAMINOPHEN-CODEINE 300-30 MG PO TABS: 1.0000 | ORAL_TABLET | Freq: Two times a day (BID) | ORAL | 0 refills | Status: AC | PRN

## 2023-02-20 MED ORDER — GABAPENTIN 300 MG PO CAPS: 300.0000 mg | ORAL_CAPSULE | Freq: Two times a day (BID) | ORAL | 0 refills | Status: AC

## 2023-02-20 NOTE — Patient Instructions (Signed)
Continue your home medications as ordered.  Take gabapentin 300mg  twice daily and tylenol with codeine twice daily to help with chronic pain. Avoid NSAIDS (ibuprofen, aleve, naproxen, motrin) - this will upset your stomach. You can take up to 500mg  tylenol every 6 hours as needed.   DRINK WATER!!! Try to drink 2-3L per day!!  [facility] [place].  Return to see me in 6 months, sooner if needed.

## 2023-02-20 NOTE — Progress Notes (Signed)
Established Patient Office Visit  Subjective:  Patient ID: Michael Donaldson, male    DOB: 05-08-1934  Age: 89 y.o. MRN: [id number]  Chief Complaint  Patient presents with   Follow-up    2 week follow up. He is still having pain. Pt needs a letter of competency for social security.    Discussed the use of AI software for clinical note transcription with the patient who gave verbal consent to proceed.   History of Present Illness The patient, with a history of recurrent urinary tract infections, hypertension, and chronic shoulder and hip pain, presented for a routine follow-up. The patient reported no specific concerns at the time of the visit. The primary focus of the consultation was the patient's hydration status, as the patient had been previously advised to increase water intake to prevent recurrent urinary tract infections. The patient reported an improvement in water intake, consuming approximately two 32-ounce hospital cups of water daily, which was a significant increase from his previous intake.  The patient's urinary symptoms were also discussed. The patient denied any current urinary symptoms, including dysuria, hematuria, or urinary frequency. The patient's urine color was reported to have lightened, likely due to increased water intake.  The patient also reported experiencing blurred vision, particularly when viewing objects at a distance. The patient denied any recent changes in vision. He does need glases.  The patient's chronic shoulder and hip pain were also discussed. The patient reported taking gabapentin and over-the-counter Aleve for pain management, but aleve has worsened his GI sx. He has been taking PPI. He was previously on norco with good relief.   The patient's cognitive status was assessed using the Mini-Mental State Examination (MMSE), which the patient passed with a normal score, (27/30) indicating no signs of cognitive decline or incompetence. The patient was  deemed capable of managing his own finances. He is concerned as his son-in-law is trying to steal his social security money.   The patient's lab results were reviewed, which showed a slightly elevated creatinine level, indicating a need for increased hydration. The patient's hemoglobin A1c was slightly elevated, indicating a prediabetic state, but was not deemed concerning at this time. The patient's iron levels were normal.  The patient's medications were reviewed, which included amlodipine for hypertension, gabapentin for pain, Xalatan eye drops, metoprolol succinate, Protonix for stomach issues, and Flomax.  In summary, the patient presented for a routine follow-up with no specific concerns. The patient reported an improvement in water intake and denied any current urinary symptoms. The patient's chronic shoulder and hip pain were discussed, and the patient was advised to get an x-ray of the shoulder. The patient's cognitive status was assessed and found to be normal. The patient's lab results were reviewed and were generally unremarkable. The patient's medications were reviewed and the patient was advised to continue them as prescribed.     Patient Active Problem List   Diagnosis Date Noted   Chronic pain syndrome 01/24/2023   Chronic kidney disease, stage 4 (severe) (HCC) 01/24/2023   Degeneration of intervertebral disc of lumbar region with discogenic back pain 01/24/2023   Personal history of kidney stones 01/24/2023   Hx of CABG 01/24/2023   Recurrent falls 01/24/2023   Personal history of gastric ulcer 01/24/2023   Anemia 01/24/2023   Other fatigue 01/24/2023   Benign prostatic hyperplasia with urinary frequency 01/24/2023   Chronic left shoulder pain 01/24/2023   Recurrent urinary tract infection 01/23/2023   Essential hypertension 01/23/2023   Acute kidney  injury (HCC) 01/23/2023   Acute cystitis without hematuria 09/29/2022   Benign prostatic hyperplasia with incomplete  bladder emptying 06/28/2022   Spinal stenosis, lumbar region without neurogenic claudication 04/10/2022   GERD (gastroesophageal reflux disease) 04/10/2022   Sepsis secondary to UTI (HCC) 04/02/2022   Bacteremia 03/04/2022   Gastrointestinal hemorrhage, unspecified 05/31/2018   CAD (coronary artery disease) 02/03/2014   Dyslipidemia    Arthritis    Anxiety    Obstructive sleep apnea    Past Medical History:  Diagnosis Date   Anxiety    Arthritis    BPH (benign prostatic hyperplasia)    Coronary artery disease    cabg x 4 in 2009   Dyslipidemia    GERD (gastroesophageal reflux disease)    Hypertension    Kidney stones    Long term prescription opiate use    Obstructive sleep apnea    Scoliosis    Shakiness    Urinary tract infection    Past Surgical History:  Procedure Laterality Date   APPENDECTOMY     CARDIAC CATHETERIZATION  09/30/2007   cabg x 4   CORONARY ARTERY BYPASS GRAFT  10/02/2007   x 4   OTHER SURGICAL HISTORY     tonsilectomy   Social History   Tobacco Use   Smoking status: Former    Types: Cigarettes   Smokeless tobacco: Never  Vaping Use   Vaping status: Never Used  Substance Use Topics   Alcohol use: Yes   Drug use: No      ROS: as noted in HPI  Objective:     BP 138/72   Pulse 69   Wt 180 lb (81.6 kg)   SpO2 95%   BMI 31.89 kg/m  BP Readings from Last 3 Encounters:  02/20/23 138/72  01/24/23 (!) 145/90  12/06/16 122/60   Wt Readings from Last 3 Encounters:  02/20/23 180 lb (81.6 kg)  01/24/23 177 lb (80.3 kg)  12/06/16 171 lb (77.6 kg)      Physical Exam Vitals and nursing note reviewed. Exam conducted with a chaperone present.  Constitutional:      General: He is not in acute distress.    Appearance: Normal appearance. He is not ill-appearing, toxic-appearing or diaphoretic.  HENT:     Head: Normocephalic and atraumatic.     Right Ear: External ear normal.     Left Ear: External ear normal.     Nose: Nose normal.      Mouth/Throat:     Mouth: Mucous membranes are moist.  Eyes:     General: No scleral icterus.       Right eye: No discharge.        Left eye: No discharge.     Extraocular Movements: Extraocular movements intact.     Pupils: Pupils are equal, round, and reactive to light.  Cardiovascular:     Rate and Rhythm: Normal rate and regular rhythm.  Pulmonary:     Effort: Pulmonary effort is normal. No respiratory distress.     Breath sounds: Normal breath sounds. No stridor. No wheezing or rhonchi.  Musculoskeletal:        General: Tenderness (L shoulder, lower back) present. No signs of injury.     Cervical back: Normal range of motion and neck supple. No rigidity or tenderness.  Lymphadenopathy:     Cervical: No cervical adenopathy.  Skin:    General: Skin is warm and dry.     Coloration: Skin is not jaundiced.  Findings: No bruising, erythema or rash.  Neurological:     General: No focal deficit present.     Mental Status: He is alert and oriented to person, place, and time.     Cranial Nerves: No cranial nerve deficit.     Sensory: No sensory deficit.     Gait: Gait abnormal (kyphotic posture, uses rollator for walking).      No results found for any visits on 02/20/23.  Last CBC Lab Results  Component Value Date   WBC 12.9 (H) 01/24/2023   HGB 13.4 01/24/2023   HCT 42.5 01/24/2023   MCV 91.6 01/24/2023   MCH 28.9 01/24/2023   RDW 14.7 01/24/2023   PLT 417 (H) 01/24/2023   Last metabolic panel Lab Results  Component Value Date   GLUCOSE 126 (H) 01/24/2023   NA 141 01/24/2023   K 4.2 01/24/2023   CL 107 01/24/2023   CO2 23 01/24/2023   BUN 20 01/24/2023   CREATININE 1.32 (H) 01/24/2023   CALCIUM 9.6 01/24/2023   PROT 7.5 01/24/2023   ALBUMIN 4.3 09/29/2007   BILITOT 0.4 01/24/2023   ALKPHOS 76 09/29/2007   AST 13 01/24/2023   ALT 9 01/24/2023   Last lipids Lab Results  Component Value Date   CHOL 167 01/24/2023   HDL 62 01/24/2023   LDLCALC 78  01/24/2023   TRIG 168 (H) 01/24/2023   CHOLHDL 2.7 01/24/2023   Last hemoglobin A1c Lab Results  Component Value Date   HGBA1C 6.0 (H) 01/24/2023      The ASCVD Risk score (Arnett DK, et al., 2019) failed to calculate for the following reasons:   The 2019 ASCVD risk score is only valid for ages 10 to 3   Risk score cannot be calculated because patient has a medical history suggesting prior/existing ASCVD  Assessment & Plan:  Essential hypertension Assessment & Plan: Hypertension Well controlled on current regimen. -Continue amlodipine and metoprolol succinate as prescribed.   Chronic pain syndrome -     Acetaminophen-Codeine; Take 1 tablet by mouth every 12 (twelve) hours as needed for severe pain (pain score 7-10).  Dispense: 60 tablet; Refill: 0 -     Gabapentin; Take 1 capsule (300 mg total) by mouth 2 (two) times daily.  Dispense: 180 capsule; Refill: 0  Chronic left shoulder pain Assessment & Plan: Shoulder Pain Possible arthritis or polymyalgia rheumatica (mildly elevated ESR). Gabapentin providing minimal relief. -Order shoulder x-ray to aid in diagnosis. -Add Tylenol with codeine for pain management, use as needed up to every 8 hours. -Continue gabapentin 300mg  twice daily.  Orders: -     DG Shoulder Left; Future  Recurrent urinary tract infection Assessment & Plan: Recurrent Urinary Tract Infections Improved with increased water intake. No current urinary symptoms. Recent urine culture negative. -Continue increased water intake, aiming for 2-3 liters per day.   Gastroesophageal reflux disease, unspecified whether esophagitis present Assessment & Plan: History of stomach ulcer with upper GI bleed. Recent use of Aleve. -Discontinue Aleve to prevent stomach upset or bleed -Continue Protonix for stomach protection.   Degeneration of intervertebral disc of lumbar region with discogenic back pain  Benign prostatic hyperplasia with urinary frequency  Total  time spent in office with patient 35 minutes   Return in about 6 months (around 08/20/2023).   Maretta Bees, PA

## 2023-02-21 ENCOUNTER — Encounter: Payer: Self-pay | Admitting: Urgent Care

## 2023-02-21 ENCOUNTER — Ambulatory Visit: Payer: Self-pay | Admitting: Urgent Care

## 2023-02-21 NOTE — Assessment & Plan Note (Signed)
History of stomach ulcer with upper GI bleed. Recent use of Aleve. -Discontinue Aleve to prevent stomach upset or bleed -Continue Protonix for stomach protection.

## 2023-02-21 NOTE — Assessment & Plan Note (Signed)
Shoulder Pain Possible arthritis or polymyalgia rheumatica (mildly elevated ESR). Gabapentin providing minimal relief. -Order shoulder x-ray to aid in diagnosis. -Add Tylenol with codeine for pain management, use as needed up to every 8 hours. -Continue gabapentin 300mg  twice daily.

## 2023-02-21 NOTE — Assessment & Plan Note (Signed)
Recurrent Urinary Tract Infections Improved with increased water intake. No current urinary symptoms. Recent urine culture negative. -Continue increased water intake, aiming for 2-3 liters per day.

## 2023-02-21 NOTE — Assessment & Plan Note (Signed)
Hypertension Well controlled on current regimen. -Continue amlodipine and metoprolol succinate as prescribed.

## 2023-03-07 ENCOUNTER — Encounter: Payer: Self-pay | Admitting: Urgent Care

## 2023-03-14 ENCOUNTER — Telehealth: Payer: Self-pay

## 2023-03-14 NOTE — Telephone Encounter (Signed)
Reason for CRM: patient called in due to receiving a letter that they missed an appt, patient is angry and does not want to be charged the $50 late fee, because they stated they were not supposed to be schedule until 6 months later , informed patient the appt for 02/06/23 was for a 2 week follow up that was no show no cancellation within 24 hours. Patient is requesting a call back at [phone]   DOS 02/06/23 1st no show is waived. This event was 1st no show.  I have tried to call patient 3 times on # [phone] Rhea Pink- patient friend DPR verified) Voicemail is full, unable to leave voicemail.  [employer] Agent: If patient calls back, please let him or Fannie know there has been no charge of $50 added to his account because the 1st no show is waived. Thank you, Annabelle Harman

## 2023-04-09 ENCOUNTER — Ambulatory Visit (INDEPENDENT_AMBULATORY_CARE_PROVIDER_SITE_OTHER)
Admission: RE | Admit: 2023-04-09 | Discharge: 2023-04-09 | Disposition: A | Discharge status: Home / Self Care | Source: Ambulatory Visit | Attending: Urgent Care | Admitting: Urgent Care

## 2023-04-09 DIAGNOSIS — G8929 Other chronic pain: Secondary | ICD-10-CM

## 2023-04-09 DIAGNOSIS — M25512 Pain in left shoulder: Secondary | ICD-10-CM

## 2023-04-30 ENCOUNTER — Other Ambulatory Visit: Payer: Self-pay | Admitting: Urgent Care

## 2023-04-30 DIAGNOSIS — M19012 Primary osteoarthritis, left shoulder: Secondary | ICD-10-CM

## 2023-04-30 NOTE — Progress Notes (Signed)
 Pt advised.

## 2023-09-03 ENCOUNTER — Other Ambulatory Visit: Payer: Self-pay

## 2023-09-03 DIAGNOSIS — G894 Chronic pain syndrome: Secondary | ICD-10-CM

## 2023-09-18 ENCOUNTER — Ambulatory Visit: Payer: Self-pay

## 2023-09-18 NOTE — Telephone Encounter (Signed)
 LVM for patient or care giver to return call to [phone]. Routed to [facility]   Copied from [employer] #[id number]. Topic: Clinical - Medical Advice >> Sep 18, 2023  4:53 PM Rea C wrote: Reason for CRM: Patient is dealing with back pain, osteoarthiritis. Patient's caregiver states that he does not want to get out of bed. He refuses to do anything but to get up to eat and go to the bathroom, maybe, because of his back pain. He refuses to take a bath. She has to beg him to take a bath. He wants diapers but he needs to get up. He refuses to do any exercises to help with his pain. He lays in bed all day and wants his caregiver to call 911 because of his back pain. Patient's caregiver would appreciate a phone call for guidance or on what she should do.    Lionel Molt (Care giver) [phone]

## 2023-09-18 NOTE — Telephone Encounter (Signed)
 Copied from CRM [id number]. Topic: Clinical - Medical Advice >> Sep 18, 2023  4:53 PM Rea C wrote: Reason for CRM: Patient is dealing with back pain, osteoarthiritis. Patient's caregiver states that he does not want to get out of bed. He refuses to do anything but to get up to eat and go to the bathroom, maybe, because of his back pain. He refuses to take a bath. She has to beg him to take a bath. He wants diapers but he needs to get up. He refuses to do any exercises to help with his pain. He lays in bed all day and wants his caregiver to call 911 because of his back pain. Patient's caregiver would appreciate a phone call for guidance or on what she should do.   Lionel Molt (Care giver) [phone]

## 2023-09-19 NOTE — Telephone Encounter (Signed)
 Patient's PCP is not at this office, sending to PCP to further advise

## 2023-09-19 NOTE — Telephone Encounter (Signed)
 LVM for pt to return call

## 2023-09-19 NOTE — Telephone Encounter (Signed)
 Could you please contact caregiver and have him go to hospital. He has a significant issue with sepsis from UTIs in the past and my concern is he likely given this scenario has another serious infection. He can go to the [facility] in [place] for a full workup... thanks

## 2023-09-24 DIAGNOSIS — R9431 Abnormal electrocardiogram [ECG] [EKG]: Secondary | ICD-10-CM | POA: Diagnosis not present

## 2023-09-24 DIAGNOSIS — I10 Essential (primary) hypertension: Secondary | ICD-10-CM | POA: Diagnosis not present

## 2023-10-18 DIAGNOSIS — R9431 Abnormal electrocardiogram [ECG] [EKG]: Secondary | ICD-10-CM | POA: Diagnosis not present

## 2023-11-17 ENCOUNTER — Telehealth: Payer: Self-pay

## 2023-11-17 NOTE — Telephone Encounter (Signed)
 I have not seen patient since January. He will need to be seen please. Have him schedule follow up in office

## 2023-11-17 NOTE — Telephone Encounter (Signed)
 Spoke with Sharlet, advised pt would need to be seen by PCP. She was given the contact information for PCP new location in [place]. She will inform pt about scheduling.

## 2023-11-17 NOTE — Telephone Encounter (Signed)
 Copied from CRM [id number]. Topic: Clinical - Home Health Verbal Orders >> Nov 17, 2023 11:58 AM Ashley SAUNDERS wrote: Caller/Agency: Sharlet Cleaves , RN ([facility]) Callback Number: [phone] Service Requested: Skilled Nursing Frequency:  week 4, 2 - 48month 1, 2 visits PRN Any new concerns about the patient? Yes  Cath in place changed on 10/14 or 15. Referral for urology, but Dr. is in [place]. instructed family to see PCP in 7-10 days. Would like to facilitate verbal order to continue.

## 2023-11-18 ENCOUNTER — Telehealth: Payer: Self-pay | Admitting: Urgent Care

## 2023-11-18 NOTE — Telephone Encounter (Signed)
 Copied from CRM [id number]. Topic: Referral - Request for Referral >> Nov 18, 2023 11:54 AM Dedra B wrote: Did the patient discuss referral with their provider in the last year? Yes  Appointment offered? No, pt has appt 10/24  Type of order/referral and detailed reason for visit: Urology, pt has catheter and urology will not take it out without referral  Preference of office, provider, location: No preference just one in [place]  If referral order, have you been seen by this specialty before? No   Can we respond through MyChart? Yes

## 2023-11-18 NOTE — Telephone Encounter (Signed)
 Pt has an appointment on 10/24 - I will place referral at that time, unless he has a follow up with urology before then?

## 2023-11-19 ENCOUNTER — Telehealth: Payer: Self-pay

## 2023-11-19 NOTE — Telephone Encounter (Signed)
 Spoke with patient.  Changed to in person appt - caregiver Alleen informed. -she will call back if the time will not work for her.

## 2023-11-19 NOTE — Telephone Encounter (Signed)
 Pt drives from [place] so I understand it is quite a long way, but a hospital follow up should be in person not virtual. Please call pt to make arrangements to get here in person please

## 2023-11-19 NOTE — Telephone Encounter (Signed)
 Copied from CRM [id number]. Topic: Clinical - Home Health Verbal Orders >> Nov 19, 2023 10:31 AM Rosaria BRAVO wrote: Caller/Agency: Mallie Pizza [facility]  Callback Number: [phone] Service Requested: Physical Therapy Frequency: 1w8 Any new concerns about the patient? Yes

## 2023-11-19 NOTE — Telephone Encounter (Signed)
 Spoke with patient caretaker [name] patient is doing fine with catheter in place and will await the visit with Benton Gave on 11/21/2023 FYI - appt shows as virtual visit not in person.

## 2023-11-19 NOTE — Telephone Encounter (Signed)
 11/19/23  1:53 PM Note Spoke with patient.  Changed to in person appt - caregiver Alleen informed. -she will call back if the time will not work for her.

## 2023-11-21 ENCOUNTER — Telehealth: Payer: Self-pay

## 2023-11-21 ENCOUNTER — Inpatient Hospital Stay: Admitting: Urgent Care

## 2023-11-21 NOTE — Telephone Encounter (Signed)
 Copied from CRM [id number]. Topic: Clinical - Home Health Verbal Orders >> Nov 19, 2023 10:31 AM Rosaria BRAVO wrote: Caller/Agency: [facility]ie [facility]  Callback Number: [phone] Service Requested: Physical Therapy Frequency: 1w8 Any new concerns about the patient? Yes >> Nov 21, 2023  9:08 AM Pinkey ORN wrote: Mallie [facility] - [facility] [phone] Called on behalf of the verbal request order, states no one called and she's wanting to check the status. Mallie is requesting a call back, gave the ok to leave a detailed messaged.

## 2023-11-21 NOTE — Telephone Encounter (Signed)
 Called and left approval of verbal orders for patient on confidential voice mail of Mallie white with [facility].

## 2023-11-21 NOTE — Telephone Encounter (Signed)
 It looks like they changed the day to 10/27. I am fine with verbal orders for PT. He has chronic pain issues and shoulder issues and is at risk of falls, so yes, this is needed. Thanks.

## 2023-11-21 NOTE — Telephone Encounter (Signed)
 Patient being seen today 11/21/2023 @ 10am  Requesting auth for verbal orders after patient has been seen.,

## 2023-11-21 NOTE — Telephone Encounter (Signed)
 Spoke with [employer] representative and informed that patient is being established today in office and would be addressed after this visit.  She was informing kate with [facility] of this.

## 2023-11-24 ENCOUNTER — Inpatient Hospital Stay: Admitting: Urgent Care

## 2023-12-12 NOTE — Telephone Encounter (Signed)
 I would recommend that patient consider establishing care at the new med center in [place] with [place]. He is 60 and relies upon friends for transportation. His trips to see me are very taxing and often he misses due to circumstances beyond his control. I feel its is in his best health interest to establish with a PCP closer to home... thanks

## 2023-12-12 NOTE — Telephone Encounter (Signed)
 Copied from CRM [id number]. Topic: Clinical - Home Health Verbal Orders >> Dec 12, 2023  8:56 AM Gustabo D wrote: Caller/Agency: Glennda with [facility] Callback Number: [phone]- secure vm Service Requested: Occupational Therapy Frequency: 1 week 7  Any new concerns about the patient? No

## 2023-12-12 NOTE — Telephone Encounter (Signed)
Patient no showed hospital follow up appointment.

## 2023-12-15 ENCOUNTER — Telehealth: Payer: Self-pay

## 2023-12-15 NOTE — Telephone Encounter (Signed)
 Copied from CRM [id number]. Topic: Clinical - Home Health Verbal Orders >> Dec 12, 2023  8:56 AM Gustabo D wrote: Caller/Agency: Glennda with [facility] Callback Number: [phone]- secure vm Service Requested: Occupational Therapy Frequency: 1 week 7  Any new concerns about the patient? No

## 2023-12-15 NOTE — Telephone Encounter (Signed)
 Okay with orders. Pt does need to be seen in office.

## 2023-12-15 NOTE — Telephone Encounter (Signed)
 Left message advising of verbal orders.

## 2023-12-15 NOTE — Telephone Encounter (Signed)
 See other telephone message

## 2024-02-20 ENCOUNTER — Encounter: Payer: Self-pay | Admitting: Urgent Care

## 2024-02-23 ENCOUNTER — Ambulatory Visit: Admitting: Podiatry
